# Patient Record
Sex: Female | Born: 1950
Health system: Southern US, Community
[De-identification: ages and names within clinical notes are randomized; demographics above are authoritative.]

## PROBLEM LIST (undated history)

## (undated) DIAGNOSIS — E348 Other specified endocrine disorders: Secondary | ICD-10-CM

## (undated) DIAGNOSIS — K900 Celiac disease: Principal | ICD-10-CM

## (undated) HISTORY — DX: Other specified endocrine disorders: E34.8

## (undated) HISTORY — DX: Celiac disease: K90.0

## (undated) HISTORY — PX: COLONOSCOPY: SHX174

## (undated) HISTORY — PX: TONSILLECTOMY: SUR1361

---

## 2006-07-04 DIAGNOSIS — K900 Celiac disease: Secondary | ICD-10-CM

## 2006-07-04 HISTORY — DX: Celiac disease: K90.0

## 2017-04-11 NOTE — Progress Notes (Signed)
Subjective: QB:[id number] care HPI: Joanna Perkins. Joanna Perkins is a 66 y.o. female presenting to clinic today for:  Celiac disease./ Lyme Patient with a past medical history significant for celiac disease which was diagnosed in 2008. She reports a strong family history of celiac disease, which affects both of her sisters. She does note a history of vitamin D deficiency several years ago. She has been on vitamin D replacement daily since then. She notes a holistic approach to her healthcare and is on no prescribed medications but on many supplements, including what is prescribed to her by a homeopathic provider in [place] for her Lyme disease, which was diagnosed in 2010. She reports complete avoidance of gluten. Her last colonoscopy was at 66 years old, she notes that this was not a clean colonoscopy but that no polyps were seen. She has never had a DEXA scan. Family history significant for osteoporosis in her grandmother.  Hx SCC Patient reports a history of squamous cell carcinoma that was excised in 2017 by Mohs surgery. This was located on the left lower jaw. She has not seen a dermatologist since then but is willing to reestablish. No fevers, chills, unplanned weight loss. No new lesions.  Postmenopausal Postmenopausal since her 86s. She's never had biologic children of her own. Last mammogram greater than 2 years ago. She notes that she has fibrous breasts and was getting them every year. She wishes to have 3-D mammography. No history of DEXA in the past. No history of osteoporosis/fracture.  She has not had a Pap in a couple of years. No abnormal vaginal discharge or bleeding.  Past Medical History:  Diagnosis Date  . Anxiety   . Celiac disease 2008   History reviewed. No pertinent surgical history. Social History   Social History  . Marital status: Married    Spouse name: N/A  . Number of children: N/A  . Years of education: N/A   Occupational History  . Not on file.   Social History  Main Topics  . Smoking status: Former Smoker    Packs/day: 1.00    Years: 15.00    Types: Cigarettes    Quit date: 54  . Smokeless tobacco: Never Used  . Alcohol use 6.6 oz/week    7 Glasses of wine, 4 Cans of beer per week     Comment: per week  . Drug use: No  . Sexual activity: Not on file   Other Topics Concern  . 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 She recently relocated resides with her husband Joanna Perkins.  No biologic children but she does have one stepchild and 2 grandchildren. She prefers holistic approach to her healthcare.   No outpatient prescriptions have been marked as taking for the 04/18/17 encounter (Office Visit) with [name], DO.   Family History  Problem Relation Age of Onset  . Asthma Mother   . Hepatitis C Father   . Breast cancer Sister   . Celiac disease Sister   . Celiac disease Brother   . Heart attack Maternal Grandfather   . Heart attack Paternal Grandfather   . Celiac disease Sister    Not on File   Health Maintenance: will need to obtain records re: vaccines.  Mammo, colonoscopy and DXA due. ROS: Per HPI  Objective: Office vital signs reviewed. BP 121/82   Pulse 69   Temp 97.8 F (36.6  C)   Wt 140 lb (63.5 kg)   Physical Examination:  General: Awake, alert, well nourished, well appearing female, No acute distress HEENT: Normal; small well healed scar along the left lower jaw.    Neck: No masses palpated. No lymphadenopathy    Ears: Tympanic membranes intact, normal light reflex, no erythema, no bulging    Eyes: PERRLA, extraocular movement in tact, sclera [place]inger  Cardio: regular rate and rhythm, S1S2 heard, no murmurs appreciated Pulm: clear to auscultation bilaterally, no wheezes, rhonchi or rales; normal work of breathing on room air Ext: WWP, no edema, +2 pulses MSK: normal gait and station Neuro: no focal deficits.  Follows  commands. Skin: no lesions, scar as above. Psych: Mood stable, speech normal, affect appropriate, thought process normal, pleasant.  Assessment/ Plan: 65 y.o. female   Celiac disease Has been well controlled with avoidance of gluten. She has not had a colonoscopy in almost 15 years. Referral to GI has been placed so that she may establish and have a screening colonoscopy performed. No red flag symptoms. We'll also obtain vitamin B12 and vitamin D labs. CBC and CMP also ordered.  History of squamous cell carcinoma I recommended that she establish with a dermatologist. Burnis Medin also plan to survey her skin on an annual basis during her physical exams. No new concerning lesions.  Lyme disease Per patient diagnosed on labs in 2010. She sees a homeopathic in [place] once a year and uses homeopathic remedies to control symptoms. She is completely asymptomatic.  Post-menopausal She has never had a DEXA scan. This is been ordered and patient will schedule this at her convenience.  Encounter to establish care with new doctor Release of information form completed by patient. We'll obtain previous records and immunizations.  Screening for hyperlipidemia Normal BMI. Per patient has not had labs done in over 2 years. - Lipid Panel  Screening for osteoporosis - [facility] DEXA  Screening for colon cancer - Ambulatory referral to Gastroenterology  Patient follow-up as needed. I'll contact her with the results of her labs once available. She will schedule her mammogram, colonoscopy, and DEXA scan. Once he studies have resulted, I will contact her.  [name], DO [place] [phone]

## 2017-04-18 ENCOUNTER — Ambulatory Visit (INDEPENDENT_AMBULATORY_CARE_PROVIDER_SITE_OTHER): Payer: Medicare Other | Admitting: Family Medicine

## 2017-04-18 ENCOUNTER — Encounter: Payer: Self-pay | Admitting: Family Medicine

## 2017-04-18 VITALS — BP 121/82 | HR 69 | Temp 97.8°F | Wt 140.0 lb

## 2017-04-18 DIAGNOSIS — Z13820 Encounter for screening for osteoporosis: Secondary | ICD-10-CM

## 2017-04-18 DIAGNOSIS — Z1211 Encounter for screening for malignant neoplasm of colon: Secondary | ICD-10-CM | POA: Diagnosis not present

## 2017-04-18 DIAGNOSIS — K900 Celiac disease: Secondary | ICD-10-CM | POA: Diagnosis not present

## 2017-04-18 DIAGNOSIS — Z8589 Personal history of malignant neoplasm of other organs and systems: Secondary | ICD-10-CM

## 2017-04-18 DIAGNOSIS — Z780 Asymptomatic menopausal state: Secondary | ICD-10-CM | POA: Insufficient documentation

## 2017-04-18 DIAGNOSIS — Z7689 Persons encountering health services in other specified circumstances: Secondary | ICD-10-CM | POA: Diagnosis not present

## 2017-04-18 DIAGNOSIS — A6920 Lyme disease, unspecified: Secondary | ICD-10-CM | POA: Diagnosis not present

## 2017-04-18 DIAGNOSIS — Z13220 Encounter for screening for lipoid disorders: Secondary | ICD-10-CM | POA: Diagnosis not present

## 2017-04-18 NOTE — Patient Instructions (Signed)
I will contact you will the results of your labs.  If anything is abnormal, I will call you.    Schedule your mammogram, DEXA, colonoscopy.  Health Maintenance, Female Adopting a healthy lifestyle and getting preventive care can go a long way to promote health and wellness. Talk with your health care provider about what schedule of regular examinations is right for you. This is a good chance for you to check in with your provider about disease prevention and staying healthy. In between checkups, there are plenty of things you can do on your own. Experts have done a lot of research about which lifestyle changes and preventive measures are most likely to keep you healthy. Ask your health care provider for more information. Weight and diet Eat a healthy diet  Be sure to include plenty of vegetables, fruits, low-fat dairy products, and lean protein.  Do not eat a lot of foods high in solid fats, added sugars, or salt.  Get regular exercise. This is one of the most important things you can do for your health. ? Most adults should exercise for at least 150 minutes each week. The exercise should increase your heart rate and make you sweat (moderate-intensity exercise). ? Most adults should also do strengthening exercises at least twice a week. This is in addition to the moderate-intensity exercise.  Maintain a healthy weight  Body mass index (BMI) is a measurement that can be used to identify possible weight problems. It estimates body fat based on height and weight. Your health care provider can help determine your BMI and help you achieve or maintain a healthy weight.  For females 5 years of age and older: ? A BMI below 18.5 is considered underweight. ? A BMI of 18.5 to 24.9 is normal. ? A BMI of 25 to 29.9 is considered overweight. ? A BMI of 30 and above is considered obese.  Watch levels of cholesterol and blood lipids  You should start having your blood tested for lipids and cholesterol  at 65 years of age, then have this test every 5 years.  You may need to have your cholesterol levels checked more often if: ? Your lipid or cholesterol levels are high. ? You are older than 65 years of age. ? You are at high risk for heart disease.  Cancer screening Lung Cancer  Lung cancer screening is recommended for adults 14-5 years old who are at high risk for lung cancer because of a history of smoking.  A yearly low-dose CT scan of the lungs is recommended for people who: ? Currently smoke. ? Have quit within the past 15 years. ? Have at least a 30-pack-year history of smoking. A pack year is smoking an average of one pack of cigarettes a day for 1 year.  Yearly screening should continue until it has been 15 years since you quit.  Yearly screening should stop if you develop a health problem that would prevent you from having lung cancer treatment.  Breast Cancer  Practice breast self-awareness. This means understanding how your breasts normally appear and feel.  It also means doing regular breast self-exams. Let your health care provider know about any changes, no matter how small.  If you are in your 20s or 30s, you should have a clinical breast exam (CBE) by a health care provider every 1-3 years as part of a regular health exam.  If you are 2 or older, have a CBE every year. Also consider having a breast X-ray (mammogram)  every year.  If you have a family history of breast cancer, talk to your health care provider about genetic screening.  If you are at high risk for breast cancer, talk to your health care provider about having an MRI and a mammogram every year.  Breast cancer gene (BRCA) assessment is recommended for women who have family members with BRCA-related cancers. BRCA-related cancers include: ? Breast. ? Ovarian. ? Tubal. ? Peritoneal cancers.  Results of the assessment will determine the need for genetic counseling and BRCA1 and BRCA2  testing.  Cervical Cancer Your health care provider may recommend that you be screened regularly for cancer of the pelvic organs (ovaries, uterus, and vagina). This screening involves a pelvic examination, including checking for microscopic changes to the surface of your cervix (Pap test). You may be encouraged to have this screening done every 3 years, beginning at age 57.  For women ages 18-65, health care providers may recommend pelvic exams and Pap testing every 3 years, or they may recommend the Pap and pelvic exam, combined with testing for human papilloma virus (HPV), every 5 years. Some types of HPV increase your risk of cervical cancer. Testing for HPV may also be done on women of any age with unclear Pap test results.  Other health care providers may not recommend any screening for nonpregnant women who are considered low risk for pelvic cancer and who do not have symptoms. Ask your health care provider if a screening pelvic exam is right for you.  If you have had past treatment for cervical cancer or a condition that could lead to cancer, you need Pap tests and screening for cancer for at least 20 years after your treatment. If Pap tests have been discontinued, your risk factors (such as having a new sexual partner) need to be reassessed to determine if screening should resume. Some women have medical problems that increase the chance of getting cervical cancer. In these cases, your health care provider may recommend more frequent screening and Pap tests.  Colorectal Cancer  This type of cancer can be detected and often prevented.  Routine colorectal cancer screening usually begins at 65 years of age and continues through 65 years of age.  Your health care provider may recommend screening at an earlier age if you have risk factors for colon cancer.  Your health care provider may also recommend using home test kits to check for hidden blood in the stool.  A small camera at the end of a  tube can be used to examine your colon directly (sigmoidoscopy or colonoscopy). This is done to check for the earliest forms of colorectal cancer.  Routine screening usually begins at age 67.  Direct examination of the colon should be repeated every 5-10 years through 65 years of age. However, you may need to be screened more often if early forms of precancerous polyps or small growths are found.  Skin Cancer  Check your skin from head to toe regularly.  Tell your health care provider about any new moles or changes in moles, especially if there is a change in a mole's shape or color.  Also tell your health care provider if you have a mole that is larger than the size of a pencil eraser.  Always use sunscreen. Apply sunscreen liberally and repeatedly throughout the day.  Protect yourself by wearing long sleeves, pants, a wide-brimmed hat, and sunglasses whenever you are outside.  Heart disease, diabetes, and high blood pressure  High blood pressure causes  heart disease and increases the risk of stroke. High blood pressure is more likely to develop in: ? People who have blood pressure in the high end of the normal range (130-139/85-89 mm Hg). ? People who are overweight or obese. ? People who are African American.  If you are 78-68 years of age, have your blood pressure checked every 3-5 years. If you are 70 years of age or older, have your blood pressure checked every year. You should have your blood pressure measured twice-once when you are at a hospital or clinic, and once when you are not at a hospital or clinic. Record the average of the two measurements. To check your blood pressure when you are not at a hospital or clinic, you can use: ? An automated blood pressure machine at a pharmacy. ? A home blood pressure monitor.  If you are between 22 years and 73 years old, ask your health care provider if you should take aspirin to prevent strokes.  Have regular diabetes screenings. This  involves taking a blood sample to check your fasting blood sugar level. ? If you are at a normal weight and have a low risk for diabetes, have this test once every three years after 65 years of age. ? If you are overweight and have a high risk for diabetes, consider being tested at a younger age or more often. Preventing infection Hepatitis B  If you have a higher risk for hepatitis B, you should be screened for this virus. You are considered at high risk for hepatitis B if: ? You were born in a country where hepatitis B is common. Ask your health care provider which countries are considered high risk. ? Your parents were born in a high-risk country, and you have not been immunized against hepatitis B (hepatitis B vaccine). ? You have HIV or AIDS. ? You use needles to inject street drugs. ? You live with someone who has hepatitis B. ? You have had sex with someone who has hepatitis B. ? You get hemodialysis treatment. ? You take certain medicines for conditions, including cancer, organ transplantation, and autoimmune conditions.  Hepatitis C  Blood testing is recommended for: ? Everyone born from [birth year] through 1964. ? Anyone with known risk factors for hepatitis C.  Sexually transmitted infections (STIs)  You should be screened for sexually transmitted infections (STIs) including gonorrhea and chlamydia if: ? You are sexually active and are younger than 65 years of age. ? You are older than 65 years of age and your health care provider tells you that you are at risk for this type of infection. ? Your sexual activity has changed since you were last screened and you are at an increased risk for chlamydia or gonorrhea. Ask your health care provider if you are at risk.  If you do not have HIV, but are at risk, it may be recommended that you take a prescription medicine daily to prevent HIV infection. This is called pre-exposure prophylaxis (PrEP). You are considered at risk if: ? You are  sexually active and do not regularly use condoms or know the HIV status of your partner(s). ? You take drugs by injection. ? You are sexually active with a partner who has HIV.  Talk with your health care provider about whether you are at high risk of being infected with HIV. If you choose to begin PrEP, you should first be tested for HIV. You should then be tested every 3 months for as long as  you are taking PrEP. Pregnancy  If you are premenopausal and you may become pregnant, ask your health care provider about preconception counseling.  If you may become pregnant, take 400 to 800 micrograms (mcg) of folic acid every day.  If you want to prevent pregnancy, talk to your health care provider about birth control (contraception). Osteoporosis and menopause  Osteoporosis is a disease in which the bones lose minerals and strength with aging. This can result in serious bone fractures. Your risk for osteoporosis can be identified using a bone density scan.  If you are 53 years of age or older, or if you are at risk for osteoporosis and fractures, ask your health care provider if you should be screened.  Ask your health care provider whether you should take a calcium or vitamin D supplement to lower your risk for osteoporosis.  Menopause may have certain physical symptoms and risks.  Hormone replacement therapy may reduce some of these symptoms and risks. Talk to your health care provider about whether hormone replacement therapy is right for you. Follow these instructions at home:  Schedule regular health, dental, and eye exams.  Stay current with your immunizations.  Do not use any tobacco products including cigarettes, chewing tobacco, or electronic cigarettes.  If you are pregnant, do not drink alcohol.  If you are breastfeeding, limit how much and how often you drink alcohol.  Limit alcohol intake to no more than 1 drink per day for nonpregnant women. One drink equals 12 ounces of  beer, 5 ounces of wine, or 1 ounces of hard liquor.  Do not use street drugs.  Do not share needles.  Ask your health care provider for help if you need support or information about quitting drugs.  Tell your health care provider if you often feel depressed.  Tell your health care provider if you have ever been abused or do not feel safe at home. This information is not intended to replace advice given to you by your health care provider. Make sure you discuss any questions you have with your health care provider. Document Released: 01/03/2011 Document Revised: 11/26/2015 Document Reviewed: 03/24/2015 Elsevier Interactive Patient Education  [employer].

## 2017-04-18 NOTE — Assessment & Plan Note (Signed)
She has never had a DEXA scan. This is been ordered and patient will schedule this at her convenience.

## 2017-04-18 NOTE — Assessment & Plan Note (Signed)
Per patient diagnosed on labs in 2010. She sees a homeopathic in [place] once a year and uses homeopathic remedies to control symptoms. She is completely asymptomatic.

## 2017-04-18 NOTE — Assessment & Plan Note (Signed)
I recommended that she establish with a dermatologist. Joanna Perkins also plan to survey her skin on an annual basis during her physical exams. No new concerning lesions.

## 2017-04-18 NOTE — Assessment & Plan Note (Signed)
Has been well controlled with avoidance of gluten. She has not had a colonoscopy in almost 15 years. Referral to GI has been placed so that she may establish and have a screening colonoscopy performed. No red flag symptoms. We'll also obtain vitamin B12 and vitamin D labs. CBC and CMP also ordered.

## 2017-04-19 LAB — CMP14+EGFR
ALBUMIN: 4.2 g/dL (ref 3.6–4.8)
ALK PHOS: 48 IU/L (ref 39–117)
ALT: 16 IU/L (ref 0–32)
AST: 23 IU/L (ref 0–40)
Albumin/Globulin Ratio: 1.7 (ref 1.2–2.2)
BUN/Creatinine Ratio: 16 (ref 12–28)
BUN: 14 mg/dL (ref 8–27)
Bilirubin Total: 0.5 mg/dL (ref 0.0–1.2)
CHLORIDE: 102 mmol/L (ref 96–106)
CO2: 23 mmol/L (ref 20–29)
CREATININE: 0.88 mg/dL (ref 0.57–1.00)
Calcium: 9.2 mg/dL (ref 8.7–10.3)
GFR calc Af Amer: 80 mL/min/{1.73_m2} (ref >59)
GFR calc non Af Amer: 69 mL/min/{1.73_m2} (ref >59)
GLUCOSE: 91 mg/dL (ref 65–99)
Globulin, Total: 2.5 g/dL (ref 1.5–4.5)
Potassium: 4.3 mmol/L (ref 3.5–5.2)
Sodium: 141 mmol/L (ref 134–144)
Total Protein: 6.7 g/dL (ref 6.0–8.5)

## 2017-04-19 LAB — CBC
Hematocrit: 40.4 % (ref 34.0–46.6)
Hemoglobin: 13 g/dL (ref 11.1–15.9)
MCH: 30.2 pg (ref 26.6–33.0)
MCHC: 32.2 g/dL (ref 31.5–35.7)
MCV: 94 fL (ref 79–97)
PLATELETS: 254 10*3/uL (ref 150–379)
RBC: 4.31 x10E6/uL (ref 3.77–5.28)
RDW: 13.9 % (ref 12.3–15.4)
WBC: 4.7 10*3/uL (ref 3.4–10.8)

## 2017-04-19 LAB — LIPID PANEL
CHOLESTEROL TOTAL: 197 mg/dL (ref 100–199)
Chol/HDL Ratio: 2.1 ratio (ref 0.0–4.4)
HDL: 93 mg/dL (ref >39)
LDL CALC: 95 mg/dL (ref 0–99)
TRIGLYCERIDES: 43 mg/dL (ref 0–149)
VLDL CHOLESTEROL CAL: 9 mg/dL (ref 5–40)

## 2017-04-19 LAB — VITAMIN B12: Vitamin B-12: 827 pg/mL (ref 232–1245)

## 2017-04-19 LAB — VITAMIN D 25 HYDROXY (VIT D DEFICIENCY, FRACTURES): VIT D 25 HYDROXY: 41.8 ng/mL (ref 30.0–100.0)

## 2017-04-22 ENCOUNTER — Encounter: Payer: Self-pay | Admitting: Family Medicine

## 2017-04-24 ENCOUNTER — Other Ambulatory Visit: Payer: Self-pay | Admitting: Family Medicine

## 2017-04-24 DIAGNOSIS — Z8589 Personal history of malignant neoplasm of other organs and systems: Secondary | ICD-10-CM

## 2017-04-27 ENCOUNTER — Other Ambulatory Visit: Payer: Medicare Other

## 2017-05-01 ENCOUNTER — Encounter: Payer: Self-pay | Admitting: Family Medicine

## 2017-05-01 ENCOUNTER — Other Ambulatory Visit (INDEPENDENT_AMBULATORY_CARE_PROVIDER_SITE_OTHER): Payer: Medicare Other

## 2017-05-01 DIAGNOSIS — M8580 Other specified disorders of bone density and structure, unspecified site: Secondary | ICD-10-CM | POA: Insufficient documentation

## 2017-05-01 DIAGNOSIS — Z23 Encounter for immunization: Secondary | ICD-10-CM

## 2017-05-01 DIAGNOSIS — Z780 Asymptomatic menopausal state: Secondary | ICD-10-CM | POA: Diagnosis not present

## 2017-05-01 NOTE — Progress Notes (Unsigned)
HD fluzone given. Pt tolerated well

## 2017-05-22 ENCOUNTER — Encounter: Payer: Self-pay | Admitting: Gastroenterology

## 2017-07-07 ENCOUNTER — Ambulatory Visit (AMBULATORY_SURGERY_CENTER): Payer: Self-pay | Admitting: *Deleted

## 2017-07-07 ENCOUNTER — Other Ambulatory Visit: Payer: Self-pay

## 2017-07-07 VITALS — Ht 67.0 in | Wt 139.2 lb

## 2017-07-07 DIAGNOSIS — Z1211 Encounter for screening for malignant neoplasm of colon: Secondary | ICD-10-CM

## 2017-07-07 MED ORDER — PEG-KCL-NACL-NASULF-NA ASC-C 140 G PO SOLR: 1.0000 | ORAL | 0 refills | Status: DC

## 2017-07-07 NOTE — Progress Notes (Signed)
No egg or soy allergy known to patient  No issues with past sedation with any surgeries  or procedures, no intubation problems  No diet pills per patient No home 02 use per patient  No blood thinners per patient  Pt denies issues with constipation  No A fib or A flutter  EMMI video sent to pt's e mail pt. declined

## 2017-07-14 ENCOUNTER — Encounter: Payer: Self-pay | Admitting: Gastroenterology

## 2017-07-19 ENCOUNTER — Other Ambulatory Visit: Payer: Self-pay | Admitting: Family Medicine

## 2017-07-19 ENCOUNTER — Encounter: Payer: Self-pay | Admitting: Family Medicine

## 2017-07-19 MED ORDER — DOXYCYCLINE HYCLATE 100 MG PO TABS: 100.0000 mg | ORAL_TABLET | Freq: Two times a day (BID) | ORAL | 0 refills | Status: AC

## 2017-07-20 ENCOUNTER — Encounter: Payer: Self-pay | Admitting: Gastroenterology

## 2017-07-20 ENCOUNTER — Other Ambulatory Visit: Payer: Self-pay

## 2017-07-20 ENCOUNTER — Ambulatory Visit (AMBULATORY_SURGERY_CENTER): Payer: Medicare Other | Admitting: Gastroenterology

## 2017-07-20 VITALS — BP 116/65 | HR 52 | Temp 98.4°F | Resp 11 | Ht 67.0 in | Wt 139.0 lb

## 2017-07-20 DIAGNOSIS — D122 Benign neoplasm of ascending colon: Secondary | ICD-10-CM

## 2017-07-20 DIAGNOSIS — Z1212 Encounter for screening for malignant neoplasm of rectum: Secondary | ICD-10-CM | POA: Diagnosis not present

## 2017-07-20 DIAGNOSIS — D126 Benign neoplasm of colon, unspecified: Secondary | ICD-10-CM

## 2017-07-20 DIAGNOSIS — D120 Benign neoplasm of cecum: Secondary | ICD-10-CM

## 2017-07-20 DIAGNOSIS — D125 Benign neoplasm of sigmoid colon: Secondary | ICD-10-CM | POA: Diagnosis not present

## 2017-07-20 DIAGNOSIS — K635 Polyp of colon: Secondary | ICD-10-CM

## 2017-07-20 DIAGNOSIS — Z1211 Encounter for screening for malignant neoplasm of colon: Secondary | ICD-10-CM | POA: Diagnosis present

## 2017-07-20 MED ORDER — SODIUM CHLORIDE 0.9 % IV SOLN: 500.0000 mL | Freq: Once | INTRAVENOUS | Status: DC

## 2017-07-20 NOTE — Patient Instructions (Signed)
YOU HAD AN ENDOSCOPIC PROCEDURE TODAY AT [facility]:   Refer to the procedure report that was given to you for any specific questions about what was found during the examination.  If the procedure report does not answer your questions, please call your gastroenterologist to clarify.  If you requested that your care partner not be given the details of your procedure findings, then the procedure report has been included in a sealed envelope for you to review at your convenience later.  YOU SHOULD EXPECT: Some feelings of bloating in the abdomen. Passage of more gas than usual.  Walking can help get rid of the air that was put into your GI tract during the procedure and reduce the bloating. If you had a lower endoscopy (such as a colonoscopy or flexible sigmoidoscopy) you may notice spotting of blood in your stool or on the toilet paper. If you underwent a bowel prep for your procedure, you may not have a normal bowel movement for a few days.  Please Note:  You might notice some irritation and congestion in your nose or some drainage.  This is from the oxygen used during your procedure.  There is no need for concern and it should clear up in a day or so.  SYMPTOMS TO REPORT IMMEDIATELY:   Following lower endoscopy (colonoscopy or flexible sigmoidoscopy):  Excessive amounts of blood in the stool  Significant tenderness or worsening of abdominal pains  Swelling of the abdomen that is new, acute  Fever of 100F or higher   For urgent or emergent issues, a gastroenterologist can be reached at any hour by calling [phone].   DIET:  We do recommend a small meal at first, but then you may proceed to your regular diet.  Drink plenty of fluids but you should avoid alcoholic beverages for 24 hours. Try to increase the fiber in your diet, and drink plenty of water.  ACTIVITY:  You should plan to take it easy for the rest of today and you should NOT DRIVE or use heavy machinery until  tomorrow (because of the sedation medicines used during the test).    FOLLOW UP: Our staff will call the number listed on your records the next business day following your procedure to check on you and address any questions or concerns that you may have regarding the information given to you following your procedure. If we do not reach you, we will leave a message.  However, if you are feeling well and you are not experiencing any problems, there is no need to return our call.  We will assume that you have returned to your regular daily activities without incident.  If any biopsies were taken you will be contacted by phone or by letter within the next 1-3 weeks.  Please call us at [phone] if you have not heard about the biopsies in 3 weeks.    SIGNATURES/CONFIDENTIALITY: You and/or your care partner have signed paperwork which will be entered into your electronic medical record.  These signatures attest to the fact that that the information above on your After Visit Summary has been reviewed and is understood.  Full responsibility of the confidentiality of this discharge information lies with you and/or your care-partner.  Read all of the handouts given to  You by your recovery room nurse.

## 2017-07-20 NOTE — Progress Notes (Signed)
Called to room to assist during endoscopic procedure.  Patient ID and intended procedure confirmed with present staff. Received instructions for my participation in the procedure from the performing physician.  

## 2017-07-20 NOTE — Op Note (Signed)
[facility] Patient Name: Joanna Perkins Procedure Date: 07/20/2017 11:00 AM MRN: [id number] Endoscopist: Remo Lipps P. Arron Tetrault MD, MD Age: 67 Referring MD:  Date of Birth: 10-15-1950 Gender: Female Account #: [id number] Procedure:                Colonoscopy Indications:              Screening for colorectal malignant neoplasm Medicines:                Monitored Anesthesia Care Procedure:                Pre-Anesthesia Assessment:                           - Prior to the procedure, a History and Physical                            was performed, and patient medications and                            allergies were reviewed. The patient's tolerance of                            previous anesthesia was also reviewed. The risks                            and benefits of the procedure and the sedation                            options and risks were discussed with the patient.                            All questions were answered, and informed consent                            was obtained. Prior Anticoagulants: The patient has                            taken no previous anticoagulant or antiplatelet                            agents. ASA Grade Assessment: II - A patient with                            mild systemic disease. After reviewing the risks                            and benefits, the patient was deemed in                            satisfactory condition to undergo the procedure.                           After obtaining informed consent, the colonoscope  was passed under direct vision. Throughout the                            procedure, the patient's blood pressure, pulse, and                            oxygen saturations were monitored continuously. The                            Model PCF-H190DL [id number]) scope was introduced                            through the anus and advanced to the the cecum,                            identified  by appendiceal orifice and ileocecal                            valve. The colonoscopy was technically difficult                            and complex due to a tortuous colon. The patient                            tolerated the procedure well. The quality of the                            bowel preparation was good. The ileocecal valve,                            appendiceal orifice, and rectum were photographed. Scope In: 11:05:19 AM Scope Out: 11:35:29 AM Scope Withdrawal Time: 0 hours 23 minutes 17 seconds  Total Procedure Duration: 0 hours 30 minutes 10 seconds  Findings:                 The perianal and digital rectal examinations were                            normal.                           A 4 mm polyp was found in the ileocecal valve. The                            polyp was sessile. The polyp was removed with a                            cold snare. Resection and retrieval were complete.                           A diminutive polyp was found in the ascending                            colon. The polyp was  sessile. The polyp was removed                            with a cold snare. Resection and retrieval were                            complete.                           A 4 mm polyp was found in the sigmoid colon. The                            polyp was sessile. The polyp was removed with a                            cold snare. Resection and retrieval were complete.                           A few small-mouthed diverticula were found in the                            ascending colon.                           Anal papilla(e) were hypertrophied.                           The colon was severely tortuous which prolonged the                            procedure.                           The exam was otherwise without abnormality. Complications:            No immediate complications. Estimated blood loss:                            Minimal. Estimated Blood Loss:      Estimated blood loss was minimal. Impression:               - One 4 mm polyp at the ileocecal valve, removed                            with a cold snare. Resected and retrieved.                           - One diminutive polyp in the ascending colon,                            removed with a cold snare. Resected and retrieved.                           - One 4 mm polyp in the sigmoid colon, removed with  a cold snare. Resected and retrieved.                           - Diverticulosis in the ascending colon.                           - Anal papilla(e) were hypertrophied.                           - Tortuous colon.                           - The examination was otherwise normal. Recommendation:           - Patient has a contact number available for                            emergencies. The signs and symptoms of potential                            delayed complications were discussed with the                            patient. Return to normal activities tomorrow.                            Written discharge instructions were provided to the                            patient.                           - Resume previous diet.                           - Continue present medications.                           - Await pathology results.                           - Repeat colonoscopy is recommended for                            surveillance. The colonoscopy date will be                            determined after pathology results from today's                            exam become available for review. Remo Lipps P. Kenton Fortin MD, MD 07/20/2017 11:40:06 AM This report has been signed electronically.

## 2017-07-20 NOTE — Progress Notes (Signed)
Pt's states no medical or surgical changes since previsit or office visit. 

## 2017-07-21 ENCOUNTER — Telehealth: Payer: Self-pay | Admitting: *Deleted

## 2017-07-21 NOTE — Telephone Encounter (Signed)
Attempted second post procedure follow up call.  No answer.  Left message.

## 2017-07-21 NOTE — Telephone Encounter (Signed)
Attempted post procedure follow up call.  No answer.  Will attempt again this afternoon.

## 2017-07-26 ENCOUNTER — Encounter: Payer: Self-pay | Admitting: Gastroenterology

## 2017-07-26 ENCOUNTER — Encounter: Payer: Self-pay | Admitting: Family Medicine

## 2017-08-28 ENCOUNTER — Encounter: Payer: Self-pay | Admitting: Family Medicine

## 2018-04-23 ENCOUNTER — Ambulatory Visit: Payer: Medicare Other

## 2018-07-18 ENCOUNTER — Encounter: Payer: Self-pay | Admitting: Family Medicine

## 2018-08-21 ENCOUNTER — Other Ambulatory Visit: Payer: Self-pay | Admitting: Sports Medicine

## 2018-08-21 ENCOUNTER — Encounter: Payer: Self-pay | Admitting: Sports Medicine

## 2018-08-21 ENCOUNTER — Ambulatory Visit (INDEPENDENT_AMBULATORY_CARE_PROVIDER_SITE_OTHER): Payer: Medicare Other

## 2018-08-21 ENCOUNTER — Ambulatory Visit: Payer: Medicare Other | Admitting: Sports Medicine

## 2018-08-21 VITALS — BP 128/68

## 2018-08-21 DIAGNOSIS — M205X1 Other deformities of toe(s) (acquired), right foot: Secondary | ICD-10-CM | POA: Diagnosis not present

## 2018-08-21 DIAGNOSIS — M79674 Pain in right toe(s): Secondary | ICD-10-CM

## 2018-08-21 NOTE — Progress Notes (Signed)
Subjective: Joanna Perkins. Neider is a 68 y.o. female patient who presents to office for evaluation of right foot pain. Patient complains of progressive pain especially over the last month patient ranks pain now 5 out of 10 states that since she cut back on walking the dog and started to rest the pain has gotten a lot better; use to be 8 out of 10.  Patient reports no known injury besides increasing activity of walking and [place].  Patient reports that she tried rest ice elevation and Epson salt soaks; patient denies any recent trauma or injury.  Review of Systems  Musculoskeletal: Positive for joint pain.     Patient Active Problem List   Diagnosis Date Noted  . Osteopenia 05/01/2017  . Celiac disease 04/18/2017  . Lyme disease 04/18/2017  . History of squamous cell carcinoma 04/18/2017  . Post-menopausal 04/18/2017    Current Outpatient Medications on File Prior to Visit  Medication Sig Dispense Refill  . b complex vitamins capsule Take 1 capsule by mouth daily.    . Calcium-Magnesium-Vitamin D (CALCIUM 500 PO) Take 1 capsule by mouth daily.    . Iodine, Kelp, (KELP PO) Take 1 capsule by mouth daily.    Marland Kitchen OVER THE COUNTER MEDICATION 1 capsule daily. Bladderwreck    . TURMERIC PO Take 1 capsule by mouth daily.     Current Facility-Administered Medications on File Prior to Visit  Medication Dose Route Frequency Provider Last Rate Last Dose  . 0.9 %  sodium chloride infusion  500 mL Intravenous Once Armbruster, Carlota Raspberry, MD        Allergies  Allergen Reactions  . Gluten Meal     Objective:  General: Alert and oriented x3 in no acute distress  Dermatology: No open lesions bilateral lower extremities, no webspace macerations, no ecchymosis bilateral, all nails x 10 are well manicured.  Vascular: Dorsalis Pedis and Posterior Tibial pedal pulses palpable, Capillary Fill Time 3 seconds,(+) pedal hair growth bilateral, no edema bilateral lower extremities,  Temperature gradient within normal limits.  Neurology: Johney Maine sensation intact via light touch bilateral.  Musculoskeletal: Mild tenderness with palpation at end range of motion at right first metatarsophalangeal joint,No pain with calf compression bilateral. There is decreased ankle rom with knee extending  vs flexed resembling gastroc equnius bilateral, Subtalar joint range of motion is within normal limits, there is no 1st ray hypermobility noted bilateral, decreased 1st MPJ rom Right>Left with functional limitus noted on weightbearing exam. Strength within normal limits in all groups bilateral.   Gait: Antalgic gait  Xrays  Right foot   Impression: There is first metatarsophalangeal joint space narrowing with dorsal spur, no fracture or dislocation, no other acute findings.  Assessment and Plan: Problem List Items Addressed This Visit    None    Visit Diagnoses    Hallux limitus of right foot    -  Primary   Pain of toe of right foot           -Complete examination performed -Xrays reviewed -Discussed treatement options for hallux limitus: Right -Patient declines steroid injection at this time or any oral medications by mouth -Rx offloading metatarsal sleeve padding -Recommend holistic medications for joint pain and arthritis -Patient to return to office as needed or sooner if condition worsens.  Advised patient that she may benefit in the future from custom orthotics if fails to get relief with offloading padding in the future.  Landis Martins, DPM

## 2018-08-21 NOTE — Patient Instructions (Signed)
Hallux Rigidus  Hallux rigidus is a type of joint pain or joint disease (arthritis) that affects your big toe (hallux). This condition involves the joint that connects the base of your big toe to the main part of your foot (metatarsophalangeal joint). This condition can cause your big toe to become stiff, painful, and difficult to move. Symptoms may get worse with movement or in cold or damp weather. The condition also gets worse over time. What are the causes? This condition may be caused by having a foot that does not function the way that it should or has an abnormal shape (structural deformity). These foot problems can run in families (be hereditary). This condition can also be caused by:  Injury.  Overuse.  Certain inflammatory diseases, including gout and rheumatoid arthritis. What increases the risk? This condition is more likely to develop in people who:  Have a foot bone (metatarsal) that is longer or higher than normal.  Have a family history of hallux rigidus.  Have previously injured their big toe.  Have feet that do not have a curve (arch) on the inner side of the foot. This may be called flat feet or fallen arches.  Turn their ankles in when they walk (pronation).  Have rheumatoid arthritis or gout.  Have to stoop down often at work. What are the signs or symptoms? Symptoms of this condition include:  Big toe pain.  Stiffness and difficulty moving the big toe.  Swelling of the toe and surrounding area.  Bone spurs. These are bony growths that can form on the joint of the big toe.  A limp. How is this diagnosed? This condition is diagnosed based on a medical history and physical exam. This may include X-rays. How is this treated? Treatment for this condition includes:  Wearing roomy, comfortable shoes that have a large toe box.  Putting orthotic devices in your shoes.  Pain medicines.  Physical therapy.  Icing the injured area.  Alternate between  putting your foot in cold water then warm water. If your condition is severe, treatment may include:  Corticosteroid injections to relieve pain.  Surgery to remove bone spurs, fuse damaged bones together, or replace the entire joint. Follow these instructions at home:  Take over-the-counter and prescription medicines only as told by your health care provider.  Do not wear high heels or other restrictive footwear. Wear comfortable, supportive shoes that have a large toe box.  Wear orthotics as told by your health care provider, if this applies.  Put your feet in cold water for 30 seconds, then in warm water for 30 seconds. Alternate between the cold and warm water for 5 minutes. Do this several times a day or as told by your health care provider.  If directed, apply ice to the injured area. ? Put ice in a plastic bag. ? Place a towel between your skin and the bag. ? Leave the ice on for 20 minutes, 2-3 times per day.  Do foot exercises as instructed by your health care provider or a physical therapist.  Keep all follow-up visits as told by your health care provider. This is important. Contact a health care provider if:  You notice bone spurs or growths on or around your big toe.  Your pain does not get better or it gets worse.  You have pain while resting.  You have pain in other parts of your body, such as your back, hip, or knee.  You start to limp. This information is not   intended to replace advice given to you by your health care provider. Make sure you discuss any questions you have with your health care provider. Document Released: 06/20/2005 Document Revised: 11/26/2015 Document Reviewed: 02/25/2015 Elsevier Interactive Patient Education  2019 [employer]  

## 2018-09-01 ENCOUNTER — Encounter: Payer: Self-pay | Admitting: Family Medicine

## 2018-09-11 LAB — HM MAMMOGRAPHY

## 2018-09-12 ENCOUNTER — Ambulatory Visit (INDEPENDENT_AMBULATORY_CARE_PROVIDER_SITE_OTHER): Payer: Medicare Other | Admitting: Family Medicine

## 2018-09-12 ENCOUNTER — Encounter: Payer: Self-pay | Admitting: Family Medicine

## 2018-09-12 ENCOUNTER — Other Ambulatory Visit: Payer: Self-pay

## 2018-09-12 VITALS — BP 126/74 | HR 76 | Temp 97.9°F | Ht 68.0 in | Wt 139.0 lb

## 2018-09-12 DIAGNOSIS — W57XXXA Bitten or stung by nonvenomous insect and other nonvenomous arthropods, initial encounter: Secondary | ICD-10-CM

## 2018-09-12 DIAGNOSIS — Z13220 Encounter for screening for lipoid disorders: Secondary | ICD-10-CM

## 2018-09-12 DIAGNOSIS — Z0001 Encounter for general adult medical examination with abnormal findings: Secondary | ICD-10-CM | POA: Diagnosis not present

## 2018-09-12 DIAGNOSIS — R5383 Other fatigue: Secondary | ICD-10-CM

## 2018-09-12 DIAGNOSIS — Z8349 Family history of other endocrine, nutritional and metabolic diseases: Secondary | ICD-10-CM

## 2018-09-12 DIAGNOSIS — Z23 Encounter for immunization: Secondary | ICD-10-CM

## 2018-09-12 DIAGNOSIS — Z0000 Encounter for general adult medical examination without abnormal findings: Secondary | ICD-10-CM

## 2018-09-12 DIAGNOSIS — K5900 Constipation, unspecified: Secondary | ICD-10-CM

## 2018-09-12 NOTE — Patient Instructions (Signed)
You had labs performed today.  You will be contacted with the results of the labs once they are available, usually in the next 3 business days for routine lab work.   Keep me posted about the tick bite.

## 2018-09-12 NOTE — Progress Notes (Signed)
Joanna Perkins. Joanna Perkins is a 68 y.o. female presents to office today for annual physical exam examination.    Concerns today include: 1.  Fatigue Patient reports a one-month history of fatigue, constipation and difficulty with sleep.  She denies any changes in weight.  No hematochezia, melena.  She is tolerating physical activity normally.  She had night sweats earlier this week but wonders if this may be related to the thyroid supplement she started over-the-counter last week.  She has a very strong family history of thyroid disorder.  She has been tested several times in the past but always been borderline.  She is also on kelp supplements in efforts to improve thyroid function.  Of note, she does report the passing of a dog last winter that caused some decreased mood.  However, she feels as of late she has been dealing with these emotions fairly well.  Denies any visual disturbance, difficulty swallowing, heart palpitations, change in voice.  Additionally, she adds she was bitten by a tick last month on her right inner thigh.  She had some doxycycline at home which she took for 1 week following the tick bite.  She does have a history of Lyme infection in the past.  She notes that the swelling seemed to go down but she still has a small area that seems not to be healing fully.  She wanted to have this checked out today.  Denies any target lesions.  Marital status: married, Substance use: none Diet: balanced, Exercise: regular cardio/ vigorous exercise Last colonoscopy: 07/20/2017, next 5 years Last mammogram: 08/2017 Last pap smear: negative 10/08/2014 (in media tab) Refills needed today: na Immunizations needed: PNA, Tdap  Past Medical History:  Diagnosis Date  . Celiac disease 2008   Social History   Socioeconomic History  . Marital status: Married    Spouse name: [name]  . Number of children: Not on file  . Years of education: Not on file  . Highest education level: Not on file  Occupational  History  . Not on file  Social Needs  . Financial resource strain: Not on file  . Food insecurity:    Worry: Not on file    Inability: Not on file  . Transportation needs:    Medical: Not on file    Non-medical: Not on file  Tobacco Use  . Smoking status: Former Smoker    Packs/day: 1.00    Years: 15.00    Pack years: 15.00    Types: Cigarettes    Last attempt to quit: 1987    Years since quitting: 33.2  . Smokeless tobacco: Never Used  Substance and Sexual Activity  . Alcohol use: Yes    Alcohol/week: 11.0 standard drinks    Types: 7 Glasses of wine, 4 Cans of beer per week    Comment: daily  . Drug use: No  . Sexual activity: Not on file  Lifestyle  . Physical activity:    Days per week: Not on file    Minutes per session: Not on file  . Stress: Not on file  Relationships  . Social connections:    Talks on phone: Not on file    Gets together: Not on file    Attends religious service: Not on file    Active member of club or organization: Not on file    Attends meetings of clubs or organizations: Not on file    Relationship status: Not on file  . Intimate partner violence:  Fear of current or ex partner: Not on file    Emotionally abused: Not on file    Physically abused: Not on file    Forced sexual activity: Not on file  Other Topics Concern  . 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 She recently relocated resides with her husband [name].  No biologic children but she does have one stepchild and 2 grandchildren. She prefers holistic approach to her healthcare.   Past Surgical History:  Procedure Laterality Date  . COLONOSCOPY     Family History  Problem Relation Age of Onset  . Asthma Mother   . Hypertension Mother   . COPD Mother   . Hepatitis C Father   . Breast cancer Sister   . Celiac disease Sister   . Thyroid disease Sister   . Celiac disease Brother   . Heart  attack Maternal Grandfather   . Heart attack Paternal Grandfather   . Celiac disease Sister   . Thyroid disease Sister   . Colon cancer Neg Hx   . Colon polyps Neg Hx   . Esophageal cancer Neg Hx   . Rectal cancer Neg Hx   . Stomach cancer Neg Hx     Current Outpatient Medications:  .  b complex vitamins capsule, Take 1 capsule by mouth daily., Disp: , Rfl:  .  Calcium-Magnesium-Vitamin D (CALCIUM 500 PO), Take 1 capsule by mouth daily., Disp: , Rfl:  .  Iodine, Kelp, (KELP PO), Take 1 capsule by mouth daily., Disp: , Rfl:  .  OVER THE COUNTER MEDICATION, 1 capsule daily. Bladderwreck, Disp: , Rfl:  .  TURMERIC PO, Take 1 capsule by mouth daily., Disp: , Rfl:   Current Facility-Administered Medications:  .  0.9 %  sodium chloride infusion, 500 mL, Intravenous, Once, Armbruster, Carlota Raspberry, MD  Allergies  Allergen Reactions  . Gluten Meal      ROS: Review of Systems Constitutional: negative Eyes: negative Ears, nose, mouth, throat, and face: negative Respiratory: negative Cardiovascular: negative Gastrointestinal: positive for constipation Genitourinary:negative Integument/breast: negative Hematologic/lymphatic: negative Musculoskeletal:negative Neurological: negative Behavioral/Psych: negative Endocrine: negative Allergic/Immunologic: negative    Physical exam BP 126/74   Pulse 76   Temp 97.9 F (36.6 C) (Oral)   Ht 5' 8"  (1.727 m)   Wt 139 lb (63 kg)   BMI 21.13 kg/m  General appearance: alert, cooperative, appears stated age and no distress Head: Normocephalic, without obvious abnormality, atraumatic Eyes: negative findings: lids and lashes normal, conjunctivae and sclerae normal, corneas clear and pupils equal, round, reactive to light and accomodation Ears: normal TM's and external ear canals both ears Nose: Nares normal. Septum midline. Mucosa normal. No drainage or sinus tenderness. Throat: lips, mucosa, and tongue normal; teeth and gums normal Neck: no  adenopathy, no carotid bruit, no JVD, supple, symmetrical, trachea midline and thyroid not enlarged, symmetric, no tenderness/mass/nodules Back: symmetric, no curvature. ROM normal. No CVA tenderness. Lungs: clear to auscultation bilaterally Heart: regular rate and rhythm, S1, S2 normal, no murmur, click, rub or gallop Abdomen: soft, non-tender; bowel sounds normal; no masses,  no organomegaly Extremities: extremities normal, atraumatic, no cyanosis or edema Pulses: 2+ and symmetric Skin: Small, mildly erythematous lesion noted on the right inner thigh.  No evidence of secondary infection.  No drainage. Lymph nodes: Cervical, supraclavicular, and axillary nodes normal. Neurologic: Alert and oriented X 3, normal strength and tone. Normal symmetric  reflexes. Normal coordination and gait Psych: Mood stable, speech normal, affect appropriate, pleasant, interactive.  Depression screen [facility] 2/9 09/12/2018 04/18/2017  Decreased Interest 2 0  Down, Depressed, Hopeless 1 0  PHQ - 2 Score 3 0  Altered sleeping 2 -  Tired, decreased energy 2 -  Change in appetite 0 -  Feeling bad or failure about yourself  0 -  Trouble concentrating 0 -  Moving slowly or fidgety/restless 0 -  Suicidal thoughts 0 -  PHQ-9 Score 7 -    Assessment/ Plan: Joanna Perkins. Mcpherson here for annual physical exam.   1. Annual physical exam Tetanus and PNA vaccines offered today.  2. Fatigue, unspecified type Uncertain etiology at this point.  We will perform metabolic work-up as well as thyroid lab given strong family history of thyroid disorder.  She does have a higher PHQ score than previous check.  I do wonder if she is experiencing SADD versus has reactive depression related to the passing of her dog. - CMP14+EGFR - Thyroid Panel With TSH - CBC  3. Tick bite, initial encounter Does not have signs or symptoms suggestive of Lyme infection at this time.  No evidence of secondary bacterial infection.  I question retained  foreign body in the area.  Continue to keep area clean and monitor for signs and symptoms of infection.  She will contact me if she has any concerns going forward with regards to this issue  4. Constipation, unspecified constipation type Possibly related to underlying thyroid disorder.  She maintains a healthy lifestyle.   5. Family history of thyroid disease - Thyroid Panel With TSH  6. Screening, lipid - Lipid Panel  Zoiey Christy M. Lajuana Ripple, DO

## 2018-09-12 NOTE — Addendum Note (Signed)
Addended byCarrolyn Leigh on: 09/12/2018 12:03 PM   Modules accepted: Orders

## 2018-09-13 ENCOUNTER — Encounter: Payer: Self-pay | Admitting: Family Medicine

## 2018-09-13 LAB — THYROID PANEL WITH TSH
Free Thyroxine Index: 2.2 (ref 1.2–4.9)
T3 Uptake Ratio: 39 % (ref 24–39)
T4, Total: 5.7 ug/dL (ref 4.5–12.0)
TSH: 3.86 u[IU]/mL (ref 0.450–4.500)

## 2018-09-13 LAB — CMP14+EGFR
ALT: 15 IU/L (ref 0–32)
AST: 21 IU/L (ref 0–40)
Albumin/Globulin Ratio: 2 (ref 1.2–2.2)
Albumin: 4.3 g/dL (ref 3.8–4.8)
Alkaline Phosphatase: 53 IU/L (ref 39–117)
BUN / CREAT RATIO: 15 (ref 12–28)
BUN: 14 mg/dL (ref 8–27)
Bilirubin Total: 0.5 mg/dL (ref 0.0–1.2)
CO2: 24 mmol/L (ref 20–29)
Calcium: 9.4 mg/dL (ref 8.7–10.3)
Chloride: 96 mmol/L (ref 96–106)
Creatinine, Ser: 0.96 mg/dL (ref 0.57–1.00)
GFR, EST AFRICAN AMERICAN: 71 mL/min/{1.73_m2} (ref >59)
GFR, EST NON AFRICAN AMERICAN: 62 mL/min/{1.73_m2} (ref >59)
GLOBULIN, TOTAL: 2.2 g/dL (ref 1.5–4.5)
GLUCOSE: 60 mg/dL — AB (ref 65–99)
Potassium: 4.1 mmol/L (ref 3.5–5.2)
SODIUM: 137 mmol/L (ref 134–144)
Total Protein: 6.5 g/dL (ref 6.0–8.5)

## 2018-09-13 LAB — CBC
HEMATOCRIT: 37.5 % (ref 34.0–46.6)
Hemoglobin: 12.8 g/dL (ref 11.1–15.9)
MCH: 30.5 pg (ref 26.6–33.0)
MCHC: 34.1 g/dL (ref 31.5–35.7)
MCV: 90 fL (ref 79–97)
Platelets: 244 10*3/uL (ref 150–450)
RBC: 4.19 x10E6/uL (ref 3.77–5.28)
RDW: 12.6 % (ref 11.7–15.4)
WBC: 5.3 10*3/uL (ref 3.4–10.8)

## 2018-09-13 LAB — LIPID PANEL
Chol/HDL Ratio: 1.9 ratio (ref 0.0–4.4)
Cholesterol, Total: 202 mg/dL — ABNORMAL HIGH (ref 100–199)
HDL: 105 mg/dL (ref >39)
LDL Calculated: 89 mg/dL (ref 0–99)
TRIGLYCERIDES: 40 mg/dL (ref 0–149)
VLDL Cholesterol Cal: 8 mg/dL (ref 5–40)

## 2018-09-14 ENCOUNTER — Other Ambulatory Visit: Payer: Self-pay | Admitting: Family Medicine

## 2018-09-14 DIAGNOSIS — R5383 Other fatigue: Secondary | ICD-10-CM

## 2018-10-08 ENCOUNTER — Other Ambulatory Visit: Payer: Self-pay | Admitting: Family Medicine

## 2018-10-08 ENCOUNTER — Encounter: Payer: Self-pay | Admitting: Family Medicine

## 2018-10-08 MED ORDER — DOXYCYCLINE HYCLATE 100 MG PO TABS: 100.0000 mg | ORAL_TABLET | Freq: Two times a day (BID) | ORAL | 0 refills | Status: AC

## 2018-11-21 ENCOUNTER — Telehealth: Payer: Self-pay | Admitting: Family Medicine

## 2018-12-05 ENCOUNTER — Encounter: Payer: Self-pay | Admitting: Family Medicine

## 2018-12-05 ENCOUNTER — Other Ambulatory Visit: Payer: Self-pay | Admitting: Family Medicine

## 2018-12-05 DIAGNOSIS — W57XXXA Bitten or stung by nonvenomous insect and other nonvenomous arthropods, initial encounter: Secondary | ICD-10-CM

## 2018-12-05 MED ORDER — DOXYCYCLINE HYCLATE 100 MG PO TABS: 100.0000 mg | ORAL_TABLET | Freq: Two times a day (BID) | ORAL | 0 refills | Status: DC

## 2019-01-07 ENCOUNTER — Encounter: Payer: Self-pay | Admitting: Family Medicine

## 2019-01-07 ENCOUNTER — Other Ambulatory Visit: Payer: Self-pay | Admitting: Family Medicine

## 2019-01-08 ENCOUNTER — Other Ambulatory Visit: Payer: Self-pay | Admitting: Family Medicine

## 2019-01-08 DIAGNOSIS — M79674 Pain in right toe(s): Secondary | ICD-10-CM

## 2019-01-15 ENCOUNTER — Other Ambulatory Visit: Payer: Self-pay

## 2019-01-15 ENCOUNTER — Encounter: Payer: Self-pay | Admitting: Sports Medicine

## 2019-01-15 ENCOUNTER — Other Ambulatory Visit: Payer: Medicare Other

## 2019-01-15 ENCOUNTER — Ambulatory Visit (INDEPENDENT_AMBULATORY_CARE_PROVIDER_SITE_OTHER): Payer: Medicare Other | Admitting: Sports Medicine

## 2019-01-15 VITALS — BP 122/72 | Ht 67.0 in | Wt 135.0 lb

## 2019-01-15 DIAGNOSIS — M19071 Primary osteoarthritis, right ankle and foot: Secondary | ICD-10-CM

## 2019-01-15 DIAGNOSIS — R5383 Other fatigue: Secondary | ICD-10-CM

## 2019-01-16 ENCOUNTER — Encounter: Payer: Self-pay | Admitting: Family Medicine

## 2019-01-16 ENCOUNTER — Encounter: Payer: Self-pay | Admitting: Sports Medicine

## 2019-01-16 LAB — TSH: TSH: 1.83 u[IU]/mL (ref 0.450–4.500)

## 2019-01-16 NOTE — Progress Notes (Signed)
   Subjective:    Patient ID: Joanna Perkins. Yaeger, female    DOB: 09-15-1950, 68 y.o.   MRN: [id number]  HPI chief complaint: Right great toe pain  Very pleasant 68 year old female comes in today complaining of 6 months of right great toe pain.  She denies any specific injury but believes that her pain may have begun as a result of pulling her dog on a leash.  She endorses pain at the first MTP joint on the right foot.  She was seen by podiatrist back in February and x-rays were ordered which are available for my review.  Patient was told that surgery was her only option.  She declined at that time.  She is here today for a second opinion.  Her pain is worse with walking.  She states that she is starting to walk with a limp and it is affecting her left hip.  She has noticed that ice as well as Triflora, a topical homeopathic agent, is helpful.  She also notices that massaging the plantar aspect of her foot helps some.  She denies any previous injury to the right great toe but she does have a history of plantar fasciitis which was treated with orthotics many years ago with great success.  She states that those orthotics have been wonderful but are starting to wear thin.  Unfortunately, she did not bring them with her today.  Past medical history reviewed.  It is significant for celiac disease diagnosed in 2008.  She also has a history of Lyme disease which was diagnosed in 1998 and she will have occasional flares requiring antibiotics. Current medications reviewed Allergies reviewed   Review of Systems    As above Objective:   Physical Exam  Well-developed, well-nourished.  No acute distress.  Awake alert and oriented x3.  Vital signs reviewed.  Examination of the right foot with attention to the right great toe shows mild hypertrophy of the joint.  Hallux rigidus with passive range of motion.  Patient is tender to palpation along the lateralmost aspect of the joint.  No erythema.  No effusion.  Brisk  capillary refill.  No tenderness to palpation along the first metatarsal.  Good pulses.  Walking with a slight limp.  X-rays of her right foot from February including AP, lateral, and oblique views are reviewed.  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  These changes are most severe on the oblique view.  Nothing acute seen.      Assessment & Plan:   Right great toe pain secondary to moderately advanced osteoarthritis of the first MTP joint  Recommended that the patient return to the office next week with her walking shoes and boots.  She will also bring her old orthotic.  We will plan on making her a new custom orthotic with a first ray post to help limit mobility at the first MTP joint.  I did discuss cortisone injections as a temporary treatment as well we are going to wait on that for now.  Patient is not interested in surgery at this time.  I will see her again sometime in the next week or 2 for her custom orthotics.  In the meantime, I have encouraged her to continue with her topical homeopathic treatment and ice as needed since these are helpful.  This note was dictated using Dragon naturally speaking software and may contain errors in syntax, spelling, or content which have not been identified prior to signing this note.

## 2019-01-22 ENCOUNTER — Ambulatory Visit: Payer: Medicare Other | Admitting: Sports Medicine

## 2019-01-22 ENCOUNTER — Encounter: Payer: Self-pay | Admitting: Sports Medicine

## 2019-01-22 ENCOUNTER — Other Ambulatory Visit: Payer: Self-pay

## 2019-01-22 VITALS — BP 125/71 | Ht 67.0 in | Wt 135.0 lb

## 2019-01-22 DIAGNOSIS — M19071 Primary osteoarthritis, right ankle and foot: Secondary | ICD-10-CM

## 2019-01-22 NOTE — Progress Notes (Signed)
Ms. Huffine presented for orthotic construction today.  Please see note from January 15, 2019 for review of systems and physical exam findings for osteoarthritis of the first MTP joint.  BP 125/71   Ht 5\' 7"  (1.702 m)   Wt 135 lb (61.2 kg)   BMI 21.14 kg/m   Procedure Patient was fitted for a standard, cushioned, semi-rigid orthotic.  The orthotic was heated and the patient stood on the orthotic blank positioned on the orthotic stand. The patient was positioned in subtalar neutral position and 10 degrees of ankle dorsiflexion in a weight bearing stance. After molding, a stable Fast-Tech EVA base was applied to the orthotic blank.   The blank was ground to a stable position for weight bearing. Size: 8 base: Blue EVA posting: R 1st ray additional orthotic padding: none  We discussed future treatment plan if this does not help resolve her pain, such as corticosteroid injection under ultrasound guidance.  Continue to ice daily.  Recommended buying Voltaren gel over-the-counter.  Follow-up as needed.  Lanier Clam, DO, AT[facility] Sports Medicine Fellow  Patient seen and evaluated with the sports medicine fellow.  I agree with the above plan of care.  Patient presents today for custom orthotics.  Orthotics were constructed as above.  Patient found them to be comfortable prior to leaving the office.  If she continues to have pain then we could consider cortisone injection into the first MTP joint.  Follow-up as needed.

## 2019-05-14 ENCOUNTER — Other Ambulatory Visit: Payer: Self-pay

## 2019-05-14 ENCOUNTER — Ambulatory Visit: Payer: Medicare Other

## 2019-05-15 ENCOUNTER — Ambulatory Visit (INDEPENDENT_AMBULATORY_CARE_PROVIDER_SITE_OTHER): Payer: Medicare Other

## 2019-05-15 DIAGNOSIS — Z23 Encounter for immunization: Secondary | ICD-10-CM

## 2019-07-31 ENCOUNTER — Encounter: Payer: Self-pay | Admitting: Family Medicine

## 2019-08-13 ENCOUNTER — Encounter: Payer: Self-pay | Admitting: Family Medicine

## 2019-08-23 ENCOUNTER — Ambulatory Visit (INDEPENDENT_AMBULATORY_CARE_PROVIDER_SITE_OTHER): Payer: Medicare PPO | Admitting: Family Medicine

## 2019-08-23 ENCOUNTER — Encounter: Payer: Self-pay | Admitting: Family Medicine

## 2019-08-23 DIAGNOSIS — M8589 Other specified disorders of bone density and structure, multiple sites: Secondary | ICD-10-CM

## 2019-08-23 DIAGNOSIS — M2550 Pain in unspecified joint: Secondary | ICD-10-CM | POA: Diagnosis not present

## 2019-08-23 DIAGNOSIS — Z1159 Encounter for screening for other viral diseases: Secondary | ICD-10-CM

## 2019-08-23 DIAGNOSIS — R5383 Other fatigue: Secondary | ICD-10-CM | POA: Diagnosis not present

## 2019-08-23 DIAGNOSIS — R413 Other amnesia: Secondary | ICD-10-CM | POA: Diagnosis not present

## 2019-08-23 DIAGNOSIS — R4589 Other symptoms and signs involving emotional state: Secondary | ICD-10-CM

## 2019-08-23 NOTE — Progress Notes (Signed)
Telephone visit  Subjective: CC: memory changes, moodiness, joint pain PCP: [name], DO Joanna Perkins is a 69 y.o. female calls for telephone consult today. Patient provides verbal consent for consult held via phone.  Due to COVID-19 pandemic this visit was conducted virtually. This visit type was conducted due to national recommendations for restrictions regarding the COVID-19 Pandemic (e.g. social distancing, sheltering in place) in an effort to limit this patient's exposure and mitigate transmission in our community. All issues noted in this document were discussed and addressed.  A physical exam was not performed with this format.   Location of patient: home  Location of provider: Working remotely from home Others present for call: husband, [name]  1. Weird symptoms/ ?thyroid issue She has been doing the homeopathy for her Lyme flare since Thanksgiving, which typically works well.  She has been resting, exercising and eating well but symptoms are not getting better.  She reports short term memory has gotten really bad lately.  She sometimes cannot even recall her dog's name/ feeding the dog.  She has sometimes gotten lost (not know where she is in proximity to other things).  She is trying to do more things to make sure she does not forget stuff (phone reminders).  Symptoms are worse over the last 2-3 months.    Moodiness has been worse over the last year or so.  She reports easy frustration/ easily angered.  She notes that sometimes out of the blue she will "tank" and feel terrible and down.  There is not consistency to why her mood changes so abruptly.  She initially thought this might be due to social alcohol consumption but symptoms are occurring outside of this now.  She reports polyarthralgia that seems to be sudden onset that is different that her normal toe/ hip pain.  She almost feels like it's body aches like when she gets sick.  She reports energy waxes and wanes  (hyper, sometimes hard to calm down and focus, and low energy levels).  She reports her maternal aunt had Alzheimer's (developed in older years).  Another maternal aunt "went crazy" in her 90s, no formal mental health diagnosis.  Both her parents passed away early (asthma).  Strong family history of celiac disease.  Both sisters has thyroid disorder.  There is some type of congenital muscular disorder in her sister that was treated with steroids as a child and resolved.  She thinks that this was a precursor to MS.  ROS: Per HPI  Allergies  Allergen Reactions  . Gluten Meal    Past Medical History:  Diagnosis Date  . Celiac disease 2008    Current Outpatient Medications:  .  b complex vitamins capsule, Take 1 capsule by mouth daily., Disp: , Rfl:  .  Calcium-Magnesium-Vitamin D (CALCIUM 500 PO), Take 1 capsule by mouth daily., Disp: , Rfl:  .  doxycycline (VIBRA-TABS) 100 MG tablet, Take 1 tablet (100 mg total) by mouth 2 (two) times daily., Disp: 20 tablet, Rfl: 0 .  Iodine, Kelp, (KELP PO), Take 1 capsule by mouth daily., Disp: , Rfl:  .  OVER THE COUNTER MEDICATION, 1 capsule daily. Bladderwreck, Disp: , Rfl:  .  TURMERIC PO, Take 1 capsule by mouth daily., Disp: , Rfl:   Assessment/ Plan: 69 y.o. female   1. Lack of energy Will evaluate for metabolic etiology. Strong family history of thyroid disorder. - Thyroid Panel With TSH; Future - Vitamin B12; Future - CBC; Future  2. Memory change  Uncertain etiology.  Will perform work-up.  May need to consider MRI of brain and formal MMSE in office.  We also discussed potential need for referral to neurology.  Will await results before making plan - Vitamin B12; Future - CBC; Future - RPR; Future - HIV antibody (with reflex); Future  3. Moodiness - Thyroid Panel With TSH; Future  4. Osteopenia of multiple sites - Thyroid Panel With TSH; Future - CMP14+EGFR; Future - VITAMIN D 25 Hydroxy (Vit-D Deficiency, Fractures);  Future  5. Encounter for hepatitis C screening test for low risk patient - Hepatitis C antibody; Future  6. Polyarthralgia - ANA w/Reflex if Positive; Future - C-reactive protein; Future - Sedimentation Rate; Future   Start time: 9:39am (LVM); 10:17am End time: 10:38am  Total time spent on patient care (including telephone call/ virtual visit): 31 minutes  [place], [place] [phone]

## 2019-08-26 ENCOUNTER — Other Ambulatory Visit: Payer: Medicare PPO

## 2019-08-26 ENCOUNTER — Other Ambulatory Visit: Payer: Self-pay

## 2019-08-26 DIAGNOSIS — M2550 Pain in unspecified joint: Secondary | ICD-10-CM

## 2019-08-26 DIAGNOSIS — Z1159 Encounter for screening for other viral diseases: Secondary | ICD-10-CM

## 2019-08-26 DIAGNOSIS — R413 Other amnesia: Secondary | ICD-10-CM

## 2019-08-26 DIAGNOSIS — R4589 Other symptoms and signs involving emotional state: Secondary | ICD-10-CM

## 2019-08-26 DIAGNOSIS — R5383 Other fatigue: Secondary | ICD-10-CM

## 2019-08-26 DIAGNOSIS — M8589 Other specified disorders of bone density and structure, multiple sites: Secondary | ICD-10-CM

## 2019-08-27 ENCOUNTER — Other Ambulatory Visit: Payer: Self-pay | Admitting: Family Medicine

## 2019-08-27 DIAGNOSIS — R5383 Other fatigue: Secondary | ICD-10-CM

## 2019-08-27 DIAGNOSIS — R768 Other specified abnormal immunological findings in serum: Secondary | ICD-10-CM

## 2019-08-27 DIAGNOSIS — M2550 Pain in unspecified joint: Secondary | ICD-10-CM

## 2019-08-27 LAB — CBC
Hematocrit: 39.6 % (ref 34.0–46.6)
Hemoglobin: 13.3 g/dL (ref 11.1–15.9)
MCH: 30.6 pg (ref 26.6–33.0)
MCHC: 33.6 g/dL (ref 31.5–35.7)
MCV: 91 fL (ref 79–97)
Platelets: 238 10*3/uL (ref 150–450)
RBC: 4.35 x10E6/uL (ref 3.77–5.28)
RDW: 12.3 % (ref 11.7–15.4)
WBC: 5.4 10*3/uL (ref 3.4–10.8)

## 2019-08-27 LAB — ANA W/REFLEX IF POSITIVE
Anti JO-1: <0.2 AI (ref 0.0–0.9)
Anti Nuclear Antibody (ANA): POSITIVE — AB
Centromere Ab Screen: <0.2 AI (ref 0.0–0.9)
Chromatin Ab SerPl-aCnc: <0.2 AI (ref 0.0–0.9)
ENA RNP Ab: 2 AI — ABNORMAL HIGH (ref 0.0–0.9)
ENA SM Ab Ser-aCnc: <0.2 AI (ref 0.0–0.9)
ENA SSA (RO) Ab: <0.2 AI (ref 0.0–0.9)
ENA SSB (LA) Ab: <0.2 AI (ref 0.0–0.9)
Scleroderma (Scl-70) (ENA) Antibody, IgG: <0.2 AI (ref 0.0–0.9)
dsDNA Ab: 1 IU/mL (ref 0–9)

## 2019-08-27 LAB — C-REACTIVE PROTEIN: CRP: <1 mg/L (ref 0–10)

## 2019-08-27 LAB — THYROID PANEL WITH TSH
Free Thyroxine Index: 2.7 (ref 1.2–4.9)
T3 Uptake Ratio: 41 % — ABNORMAL HIGH (ref 24–39)
T4, Total: 6.7 ug/dL (ref 4.5–12.0)
TSH: 1.87 u[IU]/mL (ref 0.450–4.500)

## 2019-08-27 LAB — CMP14+EGFR
ALT: 14 IU/L (ref 0–32)
AST: 23 IU/L (ref 0–40)
Albumin/Globulin Ratio: 1.8 (ref 1.2–2.2)
Albumin: 4.3 g/dL (ref 3.8–4.8)
Alkaline Phosphatase: 56 IU/L (ref 39–117)
BUN/Creatinine Ratio: 14 (ref 12–28)
BUN: 13 mg/dL (ref 8–27)
Bilirubin Total: 0.5 mg/dL (ref 0.0–1.2)
CO2: 25 mmol/L (ref 20–29)
Calcium: 9.6 mg/dL (ref 8.7–10.3)
Chloride: 102 mmol/L (ref 96–106)
Creatinine, Ser: 0.96 mg/dL (ref 0.57–1.00)
GFR calc Af Amer: 71 mL/min/{1.73_m2} (ref >59)
GFR calc non Af Amer: 61 mL/min/{1.73_m2} (ref >59)
Globulin, Total: 2.4 g/dL (ref 1.5–4.5)
Glucose: 67 mg/dL (ref 65–99)
Potassium: 3.8 mmol/L (ref 3.5–5.2)
Sodium: 140 mmol/L (ref 134–144)
Total Protein: 6.7 g/dL (ref 6.0–8.5)

## 2019-08-27 LAB — SEDIMENTATION RATE: Sed Rate: 8 mm/hr (ref 0–40)

## 2019-08-27 LAB — HIV ANTIBODY (ROUTINE TESTING W REFLEX): HIV Screen 4th Generation wRfx: NONREACTIVE

## 2019-08-27 LAB — RPR: RPR Ser Ql: NONREACTIVE

## 2019-08-27 LAB — VITAMIN B12: Vitamin B-12: 784 pg/mL (ref 232–1245)

## 2019-08-27 LAB — VITAMIN D 25 HYDROXY (VIT D DEFICIENCY, FRACTURES): Vit D, 25-Hydroxy: 53.7 ng/mL (ref 30.0–100.0)

## 2019-08-27 LAB — HEPATITIS C ANTIBODY: Hep C Virus Ab: 0.2 s/co ratio (ref 0.0–0.9)

## 2019-08-28 ENCOUNTER — Encounter: Payer: Self-pay | Admitting: Family Medicine

## 2019-09-17 DIAGNOSIS — R5383 Other fatigue: Secondary | ICD-10-CM | POA: Diagnosis not present

## 2019-09-17 DIAGNOSIS — R768 Other specified abnormal immunological findings in serum: Secondary | ICD-10-CM | POA: Diagnosis not present

## 2019-09-17 DIAGNOSIS — R519 Headache, unspecified: Secondary | ICD-10-CM | POA: Diagnosis not present

## 2019-09-17 DIAGNOSIS — M791 Myalgia, unspecified site: Secondary | ICD-10-CM | POA: Diagnosis not present

## 2019-09-17 DIAGNOSIS — R6889 Other general symptoms and signs: Secondary | ICD-10-CM | POA: Diagnosis not present

## 2019-09-18 ENCOUNTER — Encounter: Payer: Self-pay | Admitting: Family Medicine

## 2019-09-20 ENCOUNTER — Encounter: Payer: Self-pay | Admitting: Family Medicine

## 2019-10-09 ENCOUNTER — Encounter: Payer: Self-pay | Admitting: Family Medicine

## 2019-10-24 ENCOUNTER — Encounter: Payer: Self-pay | Admitting: Family Medicine

## 2019-11-03 ENCOUNTER — Encounter: Payer: Self-pay | Admitting: Family Medicine

## 2019-11-24 ENCOUNTER — Encounter: Payer: Self-pay | Admitting: Family Medicine

## 2019-11-28 ENCOUNTER — Encounter: Payer: Self-pay | Admitting: Family Medicine

## 2019-11-29 ENCOUNTER — Other Ambulatory Visit: Payer: Self-pay | Admitting: Family Medicine

## 2019-11-29 DIAGNOSIS — W57XXXA Bitten or stung by nonvenomous insect and other nonvenomous arthropods, initial encounter: Secondary | ICD-10-CM

## 2019-11-29 MED ORDER — DOXYCYCLINE HYCLATE 100 MG PO TABS: ORAL_TABLET | ORAL | 0 refills | Status: DC

## 2019-12-30 ENCOUNTER — Ambulatory Visit: Payer: Medicare PPO | Admitting: Family Medicine

## 2019-12-30 ENCOUNTER — Encounter: Payer: Self-pay | Admitting: Family Medicine

## 2019-12-30 ENCOUNTER — Other Ambulatory Visit: Payer: Self-pay

## 2019-12-30 VITALS — BP 107/59 | HR 77 | Temp 97.4°F | Ht 67.0 in | Wt 133.2 lb

## 2019-12-30 DIAGNOSIS — F5101 Primary insomnia: Secondary | ICD-10-CM

## 2019-12-30 DIAGNOSIS — K5901 Slow transit constipation: Secondary | ICD-10-CM | POA: Diagnosis not present

## 2019-12-30 MED ORDER — TRAZODONE HCL 50 MG PO TABS: 75.0000 mg | ORAL_TABLET | Freq: Every evening | ORAL | 1 refills | Status: DC | PRN

## 2019-12-30 NOTE — Progress Notes (Signed)
Subjective: CC: Follow-up fatigue, insomnia  PCP: [name], DO Joanna Perkins is a 69 y.o. female presenting to clinic today for:  1.  Fatigue/mood disturbance Patient reports that the symptoms had improved quite a bit after she got her dental implants removed.  She noted within 48 hours of removal symptoms improved substantially.  She has slowly but surely been improving since.  She has been working out in efforts to "reset her hormones".  She still feels that she has quite a bit of imbalance with the cortisol and adrenaline.  She does cite that at bedtime she feels too wound up to rest and used some of her husband's trazodone and Valium.  This did seem to help.  She would like to go ahead and get a prescription of the trazodone for her own use.  She did require full 50 mg to get to sleep but does note that she wakes up around 2 AM and sometimes has to use an additional half tablet.  She is also been seeing a chiropractor, Florene Route in [place], intermittently which has helped quite a bit with her mobility and workouts.  She does report that her bowel habits have not been as regular as they normally are.  She has a bowel movement maybe every 2 to 3 days.  She had a little bit of straining that resulted in possibly an early hemorrhoid but this improved.  Denies any rectal bleeding, nausea, vomiting, early satiety.  She continues to eat a high-fiber diet with plenty of vegetables and adequate water intake.  She is physically active as above. ROS: Per HPI  Allergies  Allergen Reactions  . Gluten Meal    Past Medical History:  Diagnosis Date  . Celiac disease 2008    Current Outpatient Medications:  .  b complex vitamins capsule, Take 1 capsule by mouth daily., Disp: , Rfl:  .  Calcium-Magnesium-Vitamin D (CALCIUM 500 PO), Take 1 capsule by mouth daily., Disp: , Rfl:  .  OVER THE COUNTER MEDICATION, 1 capsule daily. Bladderwreck, Disp: , Rfl:  .  TURMERIC PO, Take  1 capsule by mouth daily., Disp: , Rfl:  .  Iodine, Kelp, (KELP PO), Take 1 capsule by mouth daily., Disp: , Rfl:  Social History   Socioeconomic History  . Marital status: Married    Spouse name: [name]  . Number of children: Not on file  . Years of education: Not on file  . Highest education level: Not on file  Occupational History  . Not on file  Tobacco Use  . Smoking status: Former Smoker    Packs/day: 1.00    Years: 15.00    Pack years: 15.00    Types: Cigarettes    Quit date: 1987    Years since quitting: 34.5  . Smokeless tobacco: Never Used  Vaping Use  . Vaping Use: Never used  Substance and Sexual Activity  . Alcohol use: Yes    Alcohol/week: 11.0 standard drinks    Types: 7 Glasses of wine, 4 Cans of beer per week    Comment: daily  . Drug use: No  . Sexual activity: Not on file  Other Topics Concern  . 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 She recently relocated resides with her husband [name].  No biologic children but she does have one stepchild and 2 grandchildren. She prefers holistic approach to  her healthcare.   Social Determinants of Health   Financial Resource Strain:   . Difficulty of Paying Living Expenses:   Food Insecurity:   . Worried About Charity fundraiser in the Last Year:   . Arboriculturist in the Last Year:   Transportation Needs:   . Film/video editor (Medical):   Marland Kitchen Lack of Transportation (Non-Medical):   Physical Activity:   . Days of Exercise per Week:   . Minutes of Exercise per Session:   Stress:   . Feeling of Stress :   Social Connections:   . Frequency of Communication with Friends and Family:   . Frequency of Social Gatherings with Friends and Family:   . Attends Religious Services:   . Active Member of Clubs or Organizations:   . Attends Archivist Meetings:   Marland Kitchen Marital Status:   Intimate Partner Violence:   . Fear of  Current or Ex-Partner:   . Emotionally Abused:   Marland Kitchen Physically Abused:   . Sexually Abused:    Family History  Problem Relation Age of Onset  . Asthma Mother   . Hypertension Mother   . COPD Mother   . Hepatitis C Father   . Breast cancer Sister   . Celiac disease Sister   . Thyroid disease Sister   . Celiac disease Brother   . Heart attack Maternal Grandfather   . Heart attack Paternal Grandfather   . Celiac disease Sister   . Thyroid disease Sister   . Colon cancer Neg Hx   . Colon polyps Neg Hx   . Esophageal cancer Neg Hx   . Rectal cancer Neg Hx   . Stomach cancer Neg Hx     Objective: Office vital signs reviewed. BP (!) 107/59   Pulse 77   Temp (!) 97.4 F (36.3 C)   Ht 5' 7"  (1.702 m)   Wt 133 lb 3.2 oz (60.4 kg)   SpO2 98%   BMI 20.86 kg/m   Physical Examination:  General: Awake, alert, well nourished, No acute distress HEENT: Normal, sclera [place], MMM Cardio: regular rate and rhythm, S1S2 heard, no murmurs appreciated Pulm: clear to auscultation bilaterally, no wheezes, rhonchi or rales; normal work of breathing on room air GI: soft, non-tender, non-distended, bowel sounds present x4, no hepatomegaly, no splenomegaly, no masses Psych: Mood stable, speech normal, affect appropriate, pleasant and interactive Depression screen [facility] 2/9 12/30/2019 09/12/2018 04/18/2017  Decreased Interest 0 2 0  Down, Depressed, Hopeless 0 1 0  PHQ - 2 Score 0 3 0  Altered sleeping - 2 -  Tired, decreased energy - 2 -  Change in appetite - 0 -  Feeling bad or failure about yourself  - 0 -  Trouble concentrating - 0 -  Moving slowly or fidgety/restless - 0 -  Suicidal thoughts - 0 -  PHQ-9 Score - 7 -   No flowsheet data found.  Assessment/ Plan: 69 y.o. female   1. Primary insomnia I have given her own prescription of trazodone encouraged her to go to 75 mg at bedtime.  Hopefully this will improve the intermittent disturbances in sleep.  If not she can go up to 100 mg.   She may follow-up in 6 months, sooner as needed.  Advised against use of Valium. - traZODone (DESYREL) 50 MG tablet; Take 1.5-2 tablets (75-100 mg total) by mouth at bedtime as needed for sleep.  Dispense: 180 tablet; Refill: 1  2. Slow transit constipation  Since she is not really symptomatic from this, no big changes needed.  However, we discussed if she gets recurrent hemorrhoid, bleeding, other symptoms we may need to consider medication.  Briefly discussed consideration for Linzess or Trulance.  However, she is motivated to stay off medication if possible which I think is totally reasonable.  Continue good lifestyle choices.   No orders of the defined types were placed in this encounter.  No orders of the defined types were placed in this encounter.    [name], DO [place] [phone]

## 2019-12-31 ENCOUNTER — Encounter: Payer: Self-pay | Admitting: Family Medicine

## 2020-02-03 ENCOUNTER — Encounter: Payer: Self-pay | Admitting: Family Medicine

## 2020-02-03 ENCOUNTER — Other Ambulatory Visit: Payer: Self-pay | Admitting: Family Medicine

## 2020-02-03 DIAGNOSIS — W57XXXD Bitten or stung by nonvenomous insect and other nonvenomous arthropods, subsequent encounter: Secondary | ICD-10-CM

## 2020-02-07 ENCOUNTER — Other Ambulatory Visit: Payer: Self-pay

## 2020-02-07 ENCOUNTER — Other Ambulatory Visit: Payer: Medicare PPO

## 2020-02-10 ENCOUNTER — Other Ambulatory Visit: Payer: Self-pay

## 2020-02-10 ENCOUNTER — Encounter: Payer: Self-pay | Admitting: Family Medicine

## 2020-02-10 ENCOUNTER — Other Ambulatory Visit: Payer: Self-pay | Admitting: Family Medicine

## 2020-02-10 DIAGNOSIS — A6004 Herpesviral vulvovaginitis: Secondary | ICD-10-CM

## 2020-02-10 MED ORDER — VALACYCLOVIR HCL 1 G PO TABS: 1000.0000 mg | ORAL_TABLET | Freq: Every day | ORAL | 1 refills | Status: AC

## 2020-02-10 MED ORDER — VALACYCLOVIR HCL 1 G PO TABS: 1000.0000 mg | ORAL_TABLET | Freq: Every day | ORAL | 1 refills | Status: DC

## 2020-02-10 NOTE — Telephone Encounter (Signed)
Pt would the medication to be resent but to [facility].

## 2020-02-14 ENCOUNTER — Encounter: Payer: Self-pay | Admitting: Family Medicine

## 2020-02-14 LAB — ALPHA-GAL PANEL
Alpha Gal IgE*: 0.18 kU/L — ABNORMAL HIGH (ref <0.10)
Beef (Bos spp) IgE: <0.1 kU/L (ref <0.35)
Class Interpretation: 0
Class Interpretation: 0
Class Interpretation: 0
Lamb/Mutton (Ovis spp) IgE: <0.1 kU/L (ref <0.35)
Pork (Sus spp) IgE: <0.1 kU/L (ref <0.35)

## 2020-03-08 ENCOUNTER — Encounter: Payer: Self-pay | Admitting: Family Medicine

## 2020-03-10 NOTE — Telephone Encounter (Signed)
Note on med list please advise?

## 2020-03-10 NOTE — Telephone Encounter (Signed)
Appt set up for 09/27

## 2020-03-24 ENCOUNTER — Encounter: Payer: Self-pay | Admitting: Family Medicine

## 2020-03-30 ENCOUNTER — Ambulatory Visit: Payer: Medicare PPO | Admitting: Family Medicine

## 2020-03-31 ENCOUNTER — Ambulatory Visit (INDEPENDENT_AMBULATORY_CARE_PROVIDER_SITE_OTHER): Payer: Medicare PPO | Admitting: Family Medicine

## 2020-03-31 ENCOUNTER — Other Ambulatory Visit: Payer: Self-pay

## 2020-03-31 ENCOUNTER — Encounter: Payer: Self-pay | Admitting: Family Medicine

## 2020-03-31 VITALS — BP 124/70 | HR 82 | Temp 97.8°F | Ht 67.0 in | Wt 133.0 lb

## 2020-03-31 DIAGNOSIS — Z23 Encounter for immunization: Secondary | ICD-10-CM

## 2020-03-31 DIAGNOSIS — A6004 Herpesviral vulvovaginitis: Secondary | ICD-10-CM

## 2020-03-31 MED ORDER — VALACYCLOVIR HCL 1 G PO TABS: 1000.0000 mg | ORAL_TABLET | Freq: Every day | ORAL | 0 refills | Status: DC

## 2020-03-31 NOTE — Progress Notes (Signed)
Subjective: CC:? Herpes outbreak PCP: [name], DO [id number] M. Hickling is a 69 y.o. female presenting to clinic today for:  1.  Herpes outbreak Patient reports that she is now totally asymptomatic status post treatment with Valtrex.  Overall she has been feeling the best that she has felt in quite some time.  In last 10 days she had no flares of any of her ailments which include celiac, Lyme's and intolerance to the previous dental implant she had.  She feels well.  She is been avoiding alcohol as this seems to exacerbate symptoms.  She continues to maintain a healthy lifestyle and avoid gluten.  Her chronic left-sided hip pain has been under good control with the help of her chiropractor in [place].   ROS: Per HPI  Allergies  Allergen Reactions  . Gluten Meal    Past Medical History:  Diagnosis Date  . Celiac disease 2008    Current Outpatient Medications:  .  b complex vitamins capsule, Take 1 capsule by mouth daily., Disp: , Rfl:  .  Calcium-Magnesium-Vitamin D (CALCIUM 500 PO), Take 1 capsule by mouth daily., Disp: , Rfl:  .  Iodine, Kelp, (KELP PO), Take 1 capsule by mouth daily., Disp: , Rfl:  .  OVER THE COUNTER MEDICATION, 1 capsule daily. Bladderwreck, Disp: , Rfl:  .  traZODone (DESYREL) 50 MG tablet, Take 1.5-2 tablets (75-100 mg total) by mouth at bedtime as needed for sleep., Disp: 180 tablet, Rfl: 1 .  TURMERIC PO, Take 1 capsule by mouth daily., Disp: , Rfl:  Social History   Socioeconomic History  . Marital status: Married    Spouse name: [name]  . Number of children: Not on file  . Years of education: Not on file  . Highest education level: Not on file  Occupational History  . Not on file  Tobacco Use  . Smoking status: Former Smoker    Packs/day: 1.00    Years: 15.00    Pack years: 15.00    Types: Cigarettes    Quit date: 1987    Years since quitting: 34.7  . Smokeless tobacco: Never Used  Vaping Use  . Vaping Use: Never used   Substance and Sexual Activity  . Alcohol use: Yes    Alcohol/week: 11.0 standard drinks    Types: 7 Glasses of wine, 4 Cans of beer per week    Comment: daily  . Drug use: No  . Sexual activity: Not on file  Other Topics Concern  . 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 She recently relocated resides with her husband [name].  No biologic children but she does have one stepchild and 2 grandchildren. She prefers holistic approach to her healthcare.   Social Determinants of Health   Financial Resource Strain:   . Difficulty of Paying Living Expenses: Not on file  Food Insecurity:   . Worried About Charity fundraiser in the Last Year: Not on file  . Ran Out of Food in the Last Year: Not on file  Transportation Needs:   . Lack of Transportation (Medical): Not on file  . Lack of Transportation (Non-Medical): Not on file  Physical Activity:   . Days of Exercise per Week: Not on file  . Minutes of Exercise per Session: Not on file  Stress:   . Feeling of Stress : Not on file  Social Connections:   .  Frequency of Communication with Friends and Family: Not on file  . Frequency of Social Gatherings with Friends and Family: Not on file  . Attends Religious Services: Not on file  . Active Member of Clubs or Organizations: Not on file  . Attends Archivist Meetings: Not on file  . Marital Status: Not on file  Intimate Partner Violence:   . Fear of Current or Ex-Partner: Not on file  . Emotionally Abused: Not on file  . Physically Abused: Not on file  . Sexually Abused: Not on file   Family History  Problem Relation Age of Onset  . Asthma Mother   . Hypertension Mother   . COPD Mother   . Hepatitis C Father   . Breast cancer Sister   . Celiac disease Sister   . Thyroid disease Sister   . Celiac disease Brother   . Heart attack Maternal Grandfather   . Heart attack Paternal Grandfather    . Celiac disease Sister   . Thyroid disease Sister   . Colon cancer Neg Hx   . Colon polyps Neg Hx   . Esophageal cancer Neg Hx   . Rectal cancer Neg Hx   . Stomach cancer Neg Hx     Objective: Office vital signs reviewed. BP 124/70   Pulse 82   Temp 97.8 F (36.6 C) (Temporal)   Ht 5' 7"  (1.702 m)   Wt 133 lb (60.3 kg)   SpO2 100%   BMI 20.83 kg/m   Physical Examination:  General: Awake, alert, well nourished, No acute distress HEENT: Normal. Sclera [place], MMM Cardio: regular rate and rhythm, S1S2 heard, no murmurs appreciated Pulm: clear to auscultation bilaterally, no wheezes, rhonchi or rales; normal work of breathing on room air Extremities: warm, well perfused, No edema, cyanosis or clubbing; +2 pulses bilaterally  Assessment/ Plan: 69 y.o. female   1. Herpes simplex vulvovaginitis Stable with as needed use of Valtrex.  I have renewed her medication.  If she has more than 5 flares per year, we will plan for chronic suppressive therapy.  Flu shot administered.  Follow up in 73mfor full physical with fasting labs.  No orders of the defined types were placed in this encounter.  Meds ordered this encounter  Medications  . valACYclovir (VALTREX) 1000 MG tablet    Sig: Take 1 tablet (1,000 mg total) by mouth daily. X 5 days prn    Dispense:  25 tablet    Refill:  0     Cleone Hulick MWindell Moulding DO [place]([phone]

## 2020-06-30 ENCOUNTER — Ambulatory Visit: Payer: Medicare PPO | Admitting: Family Medicine

## 2020-07-06 ENCOUNTER — Encounter: Payer: Self-pay | Admitting: Family Medicine

## 2020-07-06 ENCOUNTER — Encounter: Payer: Self-pay | Admitting: Nurse Practitioner

## 2020-07-06 ENCOUNTER — Ambulatory Visit (INDEPENDENT_AMBULATORY_CARE_PROVIDER_SITE_OTHER): Payer: Medicare PPO | Admitting: Nurse Practitioner

## 2020-07-06 DIAGNOSIS — W57XXXA Bitten or stung by nonvenomous insect and other nonvenomous arthropods, initial encounter: Secondary | ICD-10-CM | POA: Diagnosis not present

## 2020-07-06 DIAGNOSIS — S1096XA Insect bite of unspecified part of neck, initial encounter: Secondary | ICD-10-CM | POA: Diagnosis not present

## 2020-07-06 MED ORDER — MUPIROCIN CALCIUM 2 % EX CREA: 1.0000 "application " | TOPICAL_CREAM | Freq: Two times a day (BID) | CUTANEOUS | 0 refills | Status: DC

## 2020-07-06 NOTE — Assessment & Plan Note (Signed)
Patient is reporting new tick bite of the neck in the last 24 hours.  Patient is reporting that prophylaxis doxycycline did not relief symptoms.  Patient is reporting that new tick bite on the neck is worsening symptoms for old tick bite from November.  Skin around new tick bite is not erythematous, patient denies fever, nausea vomiting, and no signs/symptoms of neurological changes.  Uploaded pictures on patient's chart reviewed.  Mupirocin ordered after review of patient's pictures, advised patient to follow-up with worsening symptoms, needs completing  24-hour doxycycline prophylaxis.

## 2020-07-06 NOTE — Progress Notes (Signed)
Virtual Visit via telephone Note Due to COVID-19 pandemic this visit was conducted virtually. This visit type was conducted due to national recommendations for restrictions regarding the COVID-19 Pandemic (e.g. social distancing, sheltering in place) in an effort to limit this patient's exposure and mitigate transmission in our community. All issues noted in this document were discussed and addressed.  A physical exam was not performed with this format.  I connected with Joanna Perkins. Deman on 07/06/20 at 09:30 am by telephone and verified that I am speaking with the correct person using two identifiers. Joanna Perkins. Joanna Perkins is currently located at home and husband is currently with patient during visit. The provider, Ivy Lynn, NP is located in their office at time of visit.  I discussed the limitations, risks, security and privacy concerns of performing an evaluation and management service by telephone and the availability of in person appointments. I also discussed with the patient that there may be a patient responsible charge related to this service. The patient expressed understanding and agreed to proceed.   History and Present Illness:  Patient is a 70 year old female who is seen over the phone for insect bite.  This is not new for patient.  Patient reports in the last 2 months she had a tick bite which was treated with doxycycline.  Patient reports new tick bite in the last 24 hours.  Patient reports been treated with prophylaxis doxycycline 200 mg once.  Patient believes new tick bite aggravated old tick bite which makes old tick bite red and swollen.  Patient is not reporting any fever, nausea vomiting, no redness on new tick bite site.  No coughing sneezing or shortness of breath.  Pictures of patients  old tick bite uploaded to patients chart by patient. Will review   Review of Systems  Respiratory: Negative for cough.   Cardiovascular: Negative for palpitations.  Skin: Positive for  itching and rash.  All other systems reviewed and are negative.    Observations/Objective: Tele- visit  Assessment and Plan:  Tick bite of neck Patient is reporting new tick bite of the neck in the last 24 hours.  Patient is reporting that prophylaxis doxycycline did not relief symptoms.  Patient is reporting that new tick bite on the neck is worsening symptoms for old tick bite from November.  Skin around new tick bite is not erythematous, patient denies fever, nausea vomiting, and no signs/symptoms of neurological changes.  Uploaded pictures on patient's chart reviewed.  Mupirocin ordered after review of patient's pictures, advised patient to follow-up with worsening symptoms, needs completing  24-hour doxycycline prophylaxis.     Follow Up Instructions:  Follow-up with worsening unresolved symptoms.   I discussed the assessment and treatment plan with the patient. The patient was provided an opportunity to ask questions and all were answered. The patient agreed with the plan and demonstrated an understanding of the instructions.   The patient was advised to call back or seek an in-person evaluation if the symptoms worsen or if the condition fails to improve as anticipated.  The above assessment and management plan was discussed with the patient. The patient verbalized understanding of and has agreed to the management plan. Patient is aware to call the clinic if symptoms persist or worsen. Patient is aware when to return to the clinic for a follow-up visit. Patient educated on when it is appropriate to go to the emergency department.   Time call ended: 9:46 AM  I provided 15 minutes of non-face-to-face  time during this encounter.    Ivy Lynn, NP

## 2020-07-13 ENCOUNTER — Ambulatory Visit: Payer: Medicare PPO | Admitting: Family Medicine

## 2020-07-20 ENCOUNTER — Encounter: Payer: Self-pay | Admitting: Family Medicine

## 2020-07-20 NOTE — Telephone Encounter (Signed)
Ccing this to you.  Patient seems upset and thought it warranted an extra checking into.

## 2020-08-02 ENCOUNTER — Encounter: Payer: Self-pay | Admitting: Family Medicine

## 2020-08-03 ENCOUNTER — Ambulatory Visit (INDEPENDENT_AMBULATORY_CARE_PROVIDER_SITE_OTHER): Payer: Medicare PPO | Admitting: Family Medicine

## 2020-08-03 DIAGNOSIS — S70361A Insect bite (nonvenomous), right thigh, initial encounter: Secondary | ICD-10-CM | POA: Diagnosis not present

## 2020-08-03 DIAGNOSIS — R6889 Other general symptoms and signs: Secondary | ICD-10-CM

## 2020-08-03 DIAGNOSIS — W57XXXA Bitten or stung by nonvenomous insect and other nonvenomous arthropods, initial encounter: Secondary | ICD-10-CM | POA: Diagnosis not present

## 2020-08-03 MED ORDER — DOXYCYCLINE HYCLATE 100 MG PO TABS: 100.0000 mg | ORAL_TABLET | Freq: Two times a day (BID) | ORAL | 2 refills | Status: AC

## 2020-08-03 NOTE — Progress Notes (Signed)
Telephone visit  Subjective: CC: tick bite PCP: [name], DO HTM:BPJPE M. Malbrough is a 70 y.o. female calls for telephone consult today. Patient provides verbal consent for consult held via phone.  Due to COVID-19 pandemic this visit was conducted virtually. This visit type was conducted due to national recommendations for restrictions regarding the COVID-19 Pandemic (e.g. social distancing, sheltering in place) in an effort to limit this patient's exposure and mitigate transmission in our community. All issues noted in this document were discussed and addressed.  A physical exam was not performed with this format.   [place] Location of provider: [facility] Others present for call: none  1.  Tick bite Patient reports that she sustained a tick bite to the right upper thigh on Thursday.  She took a prophylactic dose of doxycycline that she was not sure how long it was on but she knew it was at least 24 hours.  She did get a bull's-eye rash but did not find it to be pruritic or swollen.  She does report flulike symptoms including myalgia, headache, fatigue and chills.  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  She is out of the doxycycline and asking for further instructions   ROS: Per HPI  Allergies  Allergen Reactions  . Gluten Meal    Past Medical History:  Diagnosis Date  . Celiac disease 2008    Current Outpatient Medications:  .  b complex vitamins capsule, Take 1 capsule by mouth daily., Disp: , Rfl:  .  Calcium-Magnesium-Vitamin D (CALCIUM 500 PO), Take 1 capsule by mouth daily., Disp: , Rfl:  .  Iodine, Kelp, (KELP PO), Take 1 capsule by mouth daily., Disp: , Rfl:  .  mupirocin cream (BACTROBAN) 2 %, Apply 1 application topically 2 (two) times daily., Disp: 15 g, Rfl: 0 .  OVER THE COUNTER MEDICATION, 1 capsule daily. Bladderwreck, Disp: , Rfl:  .  traZODone (DESYREL) 50 MG tablet, Take 1.5-2  tablets (75-100 mg total) by mouth at bedtime as needed for sleep. (Patient not taking: Reported on 03/31/2020), Disp: 180 tablet, Rfl: 1 .  TURMERIC PO, Take 1 capsule by mouth daily., Disp: , Rfl:  .  valACYclovir (VALTREX) 1000 MG tablet, Take 1 tablet (1,000 mg total) by mouth daily. X 5 days prn, Disp: 25 tablet, Rfl: 0  Assessment/ Plan: 70 y.o. female   Tick bite of right thigh, initial encounter - Plan: doxycycline (VIBRA-TABS) 100 MG tablet  Flu-like symptoms - Plan: doxycycline (VIBRA-TABS) 100 MG tablet  Has had recurrent issues with tick bites and Lyme flares.  I have given her a 2-week course of doxycycline with a couple of refills extracts should she need them going forward.  We discussed that if symptoms are refractory to the doxycycline or she develops any other worsening symptoms or signs, she is to call the office to be evaluated in office immediately so that we can make sure not missing some other diagnosis.  She was good understanding of the plan and will follow up as needed  Start time: 12:18pm End time: 12:23pm  Total time spent on patient care (including telephone call/ virtual visit): 5 minutes  [place], [place] [phone]

## 2020-08-09 ENCOUNTER — Encounter: Payer: Self-pay | Admitting: Family Medicine

## 2020-08-11 ENCOUNTER — Other Ambulatory Visit: Payer: Self-pay

## 2020-08-11 MED ORDER — DOXYCYCLINE MONOHYDRATE 100 MG PO CAPS: 100.0000 mg | ORAL_CAPSULE | Freq: Two times a day (BID) | ORAL | 2 refills | Status: DC

## 2020-08-13 ENCOUNTER — Encounter: Payer: Self-pay | Admitting: Family Medicine

## 2020-08-14 ENCOUNTER — Other Ambulatory Visit: Payer: Self-pay | Admitting: Family Medicine

## 2020-08-14 DIAGNOSIS — R5383 Other fatigue: Secondary | ICD-10-CM

## 2020-08-19 ENCOUNTER — Encounter: Payer: Self-pay | Admitting: Family Medicine

## 2020-08-19 ENCOUNTER — Other Ambulatory Visit: Payer: Self-pay | Admitting: Family Medicine

## 2020-08-19 DIAGNOSIS — R5383 Other fatigue: Secondary | ICD-10-CM

## 2020-08-19 DIAGNOSIS — M2550 Pain in unspecified joint: Secondary | ICD-10-CM

## 2020-08-20 ENCOUNTER — Other Ambulatory Visit: Payer: Self-pay

## 2020-08-20 ENCOUNTER — Other Ambulatory Visit: Payer: Medicare PPO

## 2020-08-20 DIAGNOSIS — R5383 Other fatigue: Secondary | ICD-10-CM | POA: Diagnosis not present

## 2020-08-21 LAB — CBC WITH DIFFERENTIAL/PLATELET
Basophils Absolute: 0 10*3/uL (ref 0.0–0.2)
Basos: 1 %
EOS (ABSOLUTE): 0 10*3/uL (ref 0.0–0.4)
Eos: 1 %
Hematocrit: 40.2 % (ref 34.0–46.6)
Hemoglobin: 13.2 g/dL (ref 11.1–15.9)
Immature Grans (Abs): 0 10*3/uL (ref 0.0–0.1)
Immature Granulocytes: 0 %
Lymphocytes Absolute: 2.4 10*3/uL (ref 0.7–3.1)
Lymphs: 45 %
MCH: 30.5 pg (ref 26.6–33.0)
MCHC: 32.8 g/dL (ref 31.5–35.7)
MCV: 93 fL (ref 79–97)
Monocytes Absolute: 0.5 10*3/uL (ref 0.1–0.9)
Monocytes: 10 %
Neutrophils Absolute: 2.2 10*3/uL (ref 1.4–7.0)
Neutrophils: 43 %
Platelets: 232 10*3/uL (ref 150–450)
RBC: 4.33 x10E6/uL (ref 3.77–5.28)
RDW: 12.3 % (ref 11.7–15.4)
WBC: 5.2 10*3/uL (ref 3.4–10.8)

## 2020-08-21 LAB — CMP14+EGFR
ALT: 17 IU/L (ref 0–32)
AST: 20 IU/L (ref 0–40)
Albumin/Globulin Ratio: 1.8 (ref 1.2–2.2)
Albumin: 4.2 g/dL (ref 3.8–4.8)
Alkaline Phosphatase: 54 IU/L (ref 44–121)
BUN/Creatinine Ratio: 16 (ref 12–28)
BUN: 16 mg/dL (ref 8–27)
Bilirubin Total: 0.4 mg/dL (ref 0.0–1.2)
CO2: 23 mmol/L (ref 20–29)
Calcium: 9.3 mg/dL (ref 8.7–10.3)
Chloride: 99 mmol/L (ref 96–106)
Creatinine, Ser: 0.98 mg/dL (ref 0.57–1.00)
GFR calc Af Amer: 69 mL/min/{1.73_m2} (ref >59)
GFR calc non Af Amer: 59 mL/min/{1.73_m2} — ABNORMAL LOW (ref >59)
Globulin, Total: 2.4 g/dL (ref 1.5–4.5)
Glucose: 80 mg/dL (ref 65–99)
Potassium: 3.9 mmol/L (ref 3.5–5.2)
Sodium: 138 mmol/L (ref 134–144)
Total Protein: 6.6 g/dL (ref 6.0–8.5)

## 2020-08-21 LAB — THYROID PANEL WITH TSH
Free Thyroxine Index: 2.4 (ref 1.2–4.9)
T3 Uptake Ratio: 41 % — ABNORMAL HIGH (ref 24–39)
T4, Total: 5.9 ug/dL (ref 4.5–12.0)
TSH: 2.99 u[IU]/mL (ref 0.450–4.500)

## 2020-08-21 LAB — VITAMIN B12: Vitamin B-12: 636 pg/mL (ref 232–1245)

## 2020-09-09 NOTE — Progress Notes (Signed)
New Patient Note  RE: Joanna Perkins. Tootle MRN: [id number] DOB: 12-04-1950 Date of Office Visit: 09/10/2020  Referring provider: [name], DO Primary care provider: [name], DO  Chief Complaint: (Foods with sugar)  History of Present Illness: I had the pleasure of seeing Joanna Perkins for initial evaluation at the [facility] on 09/10/2020. She is a 70 y.o. female, who is referred here by [name], DO for the evaluation of food allergy.  Patient has issues with fatigue and not feeling well to the extent of feeling disoriented and running out of energy. Patient states that some of her issues were underlying lyme disease which was treated and thyroid issues which were aggravated by the dental implants she had. The dental implants have been removed and slowly feeling better.  Patient is just concerned whether there are foods that she is eating that may be contributing to her symptoms. Patient avoid foods that has a lot of sugar as it tends to "knock her out" - all forms of sugar in candy, potatoes, alcohol. This also affects her sleep the following day. She sometimes has associated GI symptoms but denies any hives, swelling, wheezing.  Past work up includes: none.  Patient had environmental allergy testing done 8-10 years ago which was all negative.  Dietary History: patient has been eating other foods including goat's milk, eggs,treenuts, shellfish, fish, meats, fruits and vegetables.  Peanuts, soy, wheat cause some GI issues. Patient was diagnosed with celiac's and currently eating gluten free diet.   She reports reading labels and avoiding sugary foods, potatoes, peanuts, soy, wheat in diet completely.   Assessment and Plan: Kirsten is a 70 y.o. female with: Adverse reaction to food, subsequent encounter Patient is concerned whether the foods she is eating could be contributing to some of her symptoms - fatigue, feeling disoriented. She  noticed some food triggers such as alcohol, potatoes and foods high in sugar. She was diagnosed with celiac's in the past. Currently avoiding gluten, peanuts, soy as they cause some GI symptoms.  Discussed with patient length that skin prick testing and bloodwork (food IgE levels) check for IgE mediated reactions which her clinical presentation does not support.   Keep a food journal with symptoms and foods eaten. ? If there's a specific food that seems to trigger this, then we can skin test or get bloodwork to those specific foods.  Continue to avoid foods that are bothersome to you - alcohol, potatoes, candy, gluten.  Consider trying a low FODMAP diet - handout given.  Consider re-evaluation by GI if having continued GI symptoms.  No indication for any food testing at this time.  Return if symptoms worsen or fail to improve.  No orders of the defined types were placed in this encounter.  Lab Orders  No laboratory test(s) ordered today    Other allergy screening: Asthma: no Rhino conjunctivitis: no Medication allergy: no Hymenoptera allergy:  Some shortness of breath, trouble breathing after she got stung by 30 or so ?yellow jackets. Urticaria: no Eczema:no History of recurrent infections suggestive of immunodeficency: no  Diagnostics: None.  Past Medical History: Patient Active Problem List   Diagnosis Date Noted  . Adverse reaction to food, subsequent encounter 09/10/2020  . Tick bite of neck 07/06/2020  . Osteopenia 05/01/2017  . Celiac disease 04/18/2017  . Lyme disease 04/18/2017  . History of squamous cell carcinoma 04/18/2017  . Post-menopausal 04/18/2017   Past Medical History:  Diagnosis Date  .  Celiac disease 2008   Past Surgical History: Past Surgical History:  Procedure Laterality Date  . COLONOSCOPY    . TONSILLECTOMY     Medication List:  Current Outpatient Medications  Medication Sig Dispense Refill  . b complex vitamins capsule Take 1  capsule by mouth daily.    . Calcium-Magnesium-Vitamin D (CALCIUM 500 PO) Take 1 capsule by mouth daily.    Marland Kitchen doxycycline (MONODOX) 100 MG capsule Take 1 capsule (100 mg total) by mouth 2 (two) times daily. 28 capsule 2  . Iodine, Kelp, (KELP PO) Take 1 capsule by mouth daily.    . traZODone (DESYREL) 50 MG tablet Take 1.5-2 tablets (75-100 mg total) by mouth at bedtime as needed for sleep. 180 tablet 1  . TURMERIC PO Take 1 capsule by mouth daily.    . valACYclovir (VALTREX) 1000 MG tablet Take 1 tablet (1,000 mg total) by mouth daily. X 5 days prn 25 tablet 0  . OVER THE COUNTER MEDICATION 1 capsule daily. Bladderwreck     No current facility-administered medications for this visit.   Allergies: Allergies  Allergen Reactions  . Gluten Meal    Social History: Social History   Socioeconomic History  . Marital status: Married    Spouse name: Joanna Perkins  . Number of children: Not on file  . Years of education: Not on file  . Highest education level: Not on file  Occupational History  . Not on file  Tobacco Use  . Smoking status: Former Smoker    Packs/day: 1.00    Years: 15.00    Pack years: 15.00    Types: Cigarettes    Quit date: 1987    Years since quitting: 35.2  . Smokeless tobacco: Never Used  Vaping Use  . Vaping Use: Never used  Substance and Sexual Activity  . Alcohol use: Yes    Alcohol/week: 11.0 standard drinks    Types: 7 Glasses of wine, 4 Cans of beer per week    Comment: daily  . Drug use: No  . Sexual activity: Not on file  Other Topics Concern  . 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 She recently relocated resides with her husband Joanna Perkins.  No biologic children but she does have one stepchild and 2 grandchildren. She prefers holistic approach to her healthcare.   Social Determinants of Health   Financial Resource Strain: Not on file  Food Insecurity: Not on file   Transportation Needs: Not on file  Physical Activity: Not on file  Stress: Not on file  Social Connections: Not on file   Lives in a house. Smoking: quit over 30 years ago Occupation: retired  Programme researcher, broadcasting/film/video History: Environmental education officer in the house: no Charity fundraiser in the family room: no Carpet in the bedroom: no Heating: electric Cooling: mini splits Pet: yes 3 dogs  Family History: Family History  Problem Relation Age of Onset  . Asthma Mother   . Hypertension Mother   . COPD Mother   . Allergic rhinitis Mother   . Hepatitis C Father   . Breast cancer Sister   . Celiac disease Sister   . Thyroid disease Sister   . Celiac disease Brother   . Heart attack Maternal Grandfather   . Heart attack Paternal Grandfather   . Celiac disease Sister   . Thyroid disease Sister   . Colon cancer Neg Hx   . Colon  polyps Neg Hx   . Esophageal cancer Neg Hx   . Rectal cancer Neg Hx   . Eczema Neg Hx   . Urticaria Neg Hx    Review of Systems  Constitutional: Positive for chills and fatigue. Negative for appetite change, fever and unexpected weight change.  HENT: Negative for congestion and rhinorrhea.   Eyes: Negative for itching.  Respiratory: Negative for cough, chest tightness, shortness of breath and wheezing.   Cardiovascular: Negative for chest pain.  Gastrointestinal: Positive for abdominal pain.  Genitourinary: Negative for difficulty urinating.  Skin: Negative for rash.   Objective: BP (!) 120/58 (BP Location: Right Arm, Patient Position: Sitting, Cuff Size: Normal)   Pulse 70   Temp 98.1 F (36.7 C) (Temporal)   Resp 16   Ht 5' 6.5" (1.689 m)   Wt 140 lb 12 oz (63.8 kg)   SpO2 100%   BMI 22.38 kg/m  Body mass index is 22.38 kg/m. Physical Exam Vitals and nursing note reviewed.  Constitutional:      Appearance: Normal appearance. She is well-developed.  HENT:     Head: Normocephalic and atraumatic.     Right Ear: External ear normal.     Left Ear: External ear  normal.     Nose: Nose normal.     Mouth/Throat:     Mouth: Mucous membranes are moist.     Pharynx: Oropharynx is clear.  Eyes:     Conjunctiva/sclera: Conjunctivae normal.  Cardiovascular:     Rate and Rhythm: Normal rate and regular rhythm.     Heart sounds: Normal heart sounds. No murmur heard. No friction rub. No gallop.   Pulmonary:     Effort: Pulmonary effort is normal.     Breath sounds: Normal breath sounds. No wheezing, rhonchi or rales.  Abdominal:     Palpations: Abdomen is soft.  Musculoskeletal:     Cervical back: Neck supple.  Skin:    General: Skin is warm.     Findings: No rash.  Neurological:     Mental Status: She is alert and oriented to person, place, and time.  Psychiatric:        Behavior: Behavior normal.    The plan was reviewed with the patient/family, and all questions/concerned were addressed.  It was my pleasure to see Shaquoya today and participate in her care. Please feel free to contact me with any questions or concerns.  Sincerely,  Rexene Alberts, DO Allergy & Immunology  [facility] office: [place] office: [phone]

## 2020-09-10 ENCOUNTER — Ambulatory Visit: Payer: Medicare PPO | Admitting: Allergy

## 2020-09-10 ENCOUNTER — Other Ambulatory Visit: Payer: Self-pay

## 2020-09-10 ENCOUNTER — Encounter: Payer: Self-pay | Admitting: Allergy

## 2020-09-10 VITALS — BP 120/58 | HR 70 | Temp 98.1°F | Resp 16 | Ht 66.5 in | Wt 140.8 lb

## 2020-09-10 DIAGNOSIS — R5383 Other fatigue: Secondary | ICD-10-CM | POA: Diagnosis not present

## 2020-09-10 DIAGNOSIS — K900 Celiac disease: Secondary | ICD-10-CM

## 2020-09-10 DIAGNOSIS — T781XXD Other adverse food reactions, not elsewhere classified, subsequent encounter: Secondary | ICD-10-CM | POA: Diagnosis not present

## 2020-09-10 NOTE — Patient Instructions (Signed)
   Skin prick testing and bloodwork (food IgE levels) check for IgE mediated reactions which your clinical presentation does not support.   Keep a food journal with symptoms and foods eaten. ? If there's a specific food that seems to trigger this, then we can skin test or get bloodwork to those specific foods.   Continue to avoid foods that are bothersome to you - alcohol, potatoes, candy, gluten.  Consider trying a low FODMAP diet - handout given.  Follow up as needed.

## 2020-09-10 NOTE — Assessment & Plan Note (Addendum)
Patient is concerned whether the foods she is eating could be contributing to some of her symptoms - fatigue, feeling disoriented. She noticed some food triggers such as alcohol, potatoes and foods high in sugar. She was diagnosed with celiac's in the past. Currently avoiding gluten, peanuts, soy as they cause some GI symptoms.  Discussed with patient length that skin prick testing and bloodwork (food IgE levels) check for IgE mediated reactions which her clinical presentation does not support.   Keep a food journal with symptoms and foods eaten. ? If there's a specific food that seems to trigger this, then we can skin test or get bloodwork to those specific foods.  Continue to avoid foods that are bothersome to you - alcohol, potatoes, candy, gluten.  Consider trying a low FODMAP diet - handout given.  Consider re-evaluation by GI if having continued GI symptoms.  No indication for any food testing at this time.

## 2020-11-23 DIAGNOSIS — M5136 Other intervertebral disc degeneration, lumbar region: Secondary | ICD-10-CM | POA: Diagnosis not present

## 2020-11-23 DIAGNOSIS — G8929 Other chronic pain: Secondary | ICD-10-CM | POA: Diagnosis not present

## 2020-11-23 DIAGNOSIS — M5137 Other intervertebral disc degeneration, lumbosacral region: Secondary | ICD-10-CM | POA: Diagnosis not present

## 2020-11-23 DIAGNOSIS — M25552 Pain in left hip: Secondary | ICD-10-CM | POA: Diagnosis not present

## 2020-11-23 DIAGNOSIS — M7582 Other shoulder lesions, left shoulder: Secondary | ICD-10-CM | POA: Diagnosis not present

## 2020-11-23 DIAGNOSIS — M47816 Spondylosis without myelopathy or radiculopathy, lumbar region: Secondary | ICD-10-CM | POA: Diagnosis not present

## 2020-11-24 ENCOUNTER — Encounter: Payer: Self-pay | Admitting: Family Medicine

## 2020-11-25 ENCOUNTER — Other Ambulatory Visit: Payer: Self-pay

## 2020-11-25 ENCOUNTER — Ambulatory Visit (INDEPENDENT_AMBULATORY_CARE_PROVIDER_SITE_OTHER): Payer: Medicare PPO

## 2020-11-25 DIAGNOSIS — M8589 Other specified disorders of bone density and structure, multiple sites: Secondary | ICD-10-CM

## 2020-11-25 DIAGNOSIS — Z780 Asymptomatic menopausal state: Secondary | ICD-10-CM

## 2020-11-25 IMAGING — CR DEXA BONE DENSITY STUDY
1 series · 4 of 4 positions shown · non-contrast
Comparison: None.

CLINICAL DATA: Postmenopausal.

EXAM:
DUAL X-RAY ABSORPTIOMETRY (DXA) FOR BONE MINERAL DENSITY
TECHNIQUE: Bone mineral density measurements are performed of the spine, hip,
and forearm, as appropriate, per International Society of Clinical
Densitometry recommendations. The pertinent regions of interest are
reported below. Non-contributory values are not reported. Images are
obtained for bone mineral density measurement and are not obtained
for diagnostic purposes.

[Series 2: dxa images · 4 of 5 slices shown]
[im 1/5]
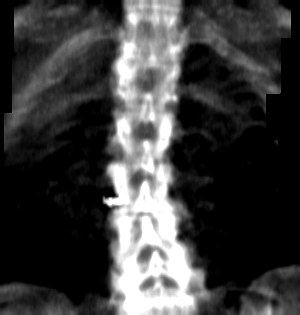
[im 2/5]
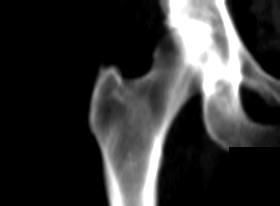
[im 3/5]
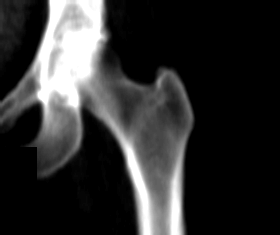
[im 5/5]
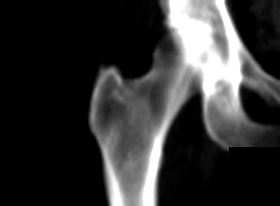

[4 of 4 positions shown; findings below may reference images not displayed]

FINDINGS: AP LUMBAR SPINE L1 and L2

Bone Mineral Density (BMD):  0.997 g/cm2

Young Adult T-Score:  -1.5

Z-Score:

LEFT FEMUR NECK

Bone Mineral Density (BMD):  0.81 g/cm2

Young Adult T-Score: -1.1

Z-Score:

Unit: This study was performed at [HOSPITAL]
on a GE Prodigy Advance.

Scan quality: The scan quality is good. Exclusions: L3 and L4
excluded due to degenerative change.

ASSESSMENT: Patient's diagnostic category is LOW BONE
MASS/OSTEOPENIA by WHO Criteria.

FRACTURE RISK: INCREASED

FRAX: Based on the World Health Organization FRAX model, the 10 year
probability of a major osteoporotic fracture is 7.8%. The 10 year
probability of a hip fracture is 0.7%.
RECOMMENDATIONS

1. All patients should optimize calcium and vitamin D intake.

2. Consider FDA-approved medical therapies in postmenopausal women
and men aged 50 years and older, based on the following:

- A hip or vertebral (clinical or morphometric) fracture

- T-score less than or equal to -2.5 at the femoral neck or spine
after appropriate evaluation to exclude secondary causes

- Low bone mass (T-score between -1.0 and -2.5 at the femoral neck
or spine) and a 10-year probability of a hip fracture greater than
or equal to 3% or a 10-year probability of a major
osteoporosis-related fracture greater than or equal to 20% based on
the [place]-adapted WHO algorithm

- Clinician judgment and/or patient preferences may indicate
treatment for people with 10-year fracture probabilities above or
below these levels

3. Patients with diagnosis of osteoporosis or at high risk for
fracture should have regular bone mineral density tests. For
patients eligible for Medicare, routine testing is allowed once
every 2 years. The testing frequency can be increased to one year
for patients who have rapidly progressing disease, those who are
receiving or discontinuing medical therapy to restore bone mass, or
have additional risk factors.

## 2020-11-27 DIAGNOSIS — Z780 Asymptomatic menopausal state: Secondary | ICD-10-CM | POA: Diagnosis not present

## 2020-11-27 DIAGNOSIS — M8589 Other specified disorders of bone density and structure, multiple sites: Secondary | ICD-10-CM | POA: Diagnosis not present

## 2020-12-10 NOTE — Progress Notes (Signed)
Pt r/c about DEXA Scan results

## 2020-12-31 ENCOUNTER — Encounter: Payer: Self-pay | Admitting: Family Medicine

## 2021-01-01 ENCOUNTER — Other Ambulatory Visit: Payer: Self-pay | Admitting: Family Medicine

## 2021-01-01 MED ORDER — DOXYCYCLINE HYCLATE 100 MG PO TABS
100.0000 mg | ORAL_TABLET | Freq: Two times a day (BID) | ORAL | 1 refills | Status: DC
Start: 2021-01-01 — End: 2021-07-27

## 2021-03-07 ENCOUNTER — Encounter: Payer: Self-pay | Admitting: Family Medicine

## 2021-03-09 NOTE — Telephone Encounter (Signed)
Was seeing an orthopedist for this and was told it was NOT her hip but her back.  Since I have not seen her for this personally, she needs to ask for MRI from her examining provider OR she will need to be seen in office (ok to schedule with another provider if I do not have anything).  Please make sure that we have the records from her orthopedist as well.

## 2021-03-11 ENCOUNTER — Encounter: Payer: Self-pay | Admitting: Family Medicine

## 2021-03-19 ENCOUNTER — Other Ambulatory Visit: Payer: Self-pay | Admitting: Orthopedic Surgery

## 2021-03-19 DIAGNOSIS — M5416 Radiculopathy, lumbar region: Secondary | ICD-10-CM

## 2021-04-02 ENCOUNTER — Other Ambulatory Visit: Payer: Medicare Other

## 2021-04-05 ENCOUNTER — Ambulatory Visit
Admission: RE | Admit: 2021-04-05 | Discharge: 2021-04-05 | Disposition: A | Discharge status: Home / Self Care | Payer: Medicare PPO | Source: Ambulatory Visit | Attending: Orthopedic Surgery | Admitting: Orthopedic Surgery

## 2021-04-05 DIAGNOSIS — M5416 Radiculopathy, lumbar region: Secondary | ICD-10-CM

## 2021-04-05 DIAGNOSIS — M545 Low back pain, unspecified: Secondary | ICD-10-CM | POA: Diagnosis not present

## 2021-04-05 DIAGNOSIS — M48061 Spinal stenosis, lumbar region without neurogenic claudication: Secondary | ICD-10-CM | POA: Diagnosis not present

## 2021-04-05 IMAGING — MR MR LUMBAR SPINE W/O CM
4 of 5 series · 18 of 48 positions shown · non-contrast
Comparison: None.

CLINICAL DATA: Lumbar radiculopathy with left leg pain.

EXAM:
MRI LUMBAR SPINE WITHOUT CONTRAST
TECHNIQUE: Multiplanar, multisequence MR imaging of the lumbar spine was
performed. No intravenous contrast was administered.

[Series 5: T2 · sagittal · 4.0mm · 0.73mm/px · 6 of 15 slices shown (1 of 2)]
[im 1/15]
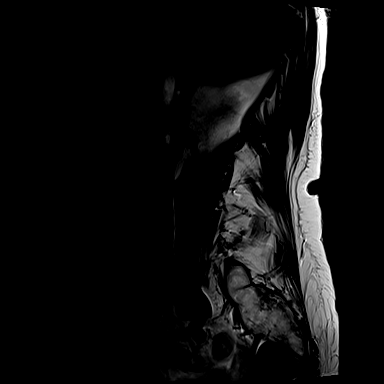
[im 3/15]
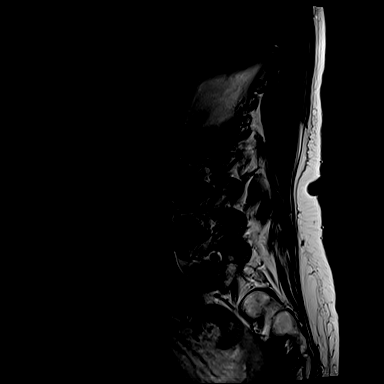
[im 6/15]
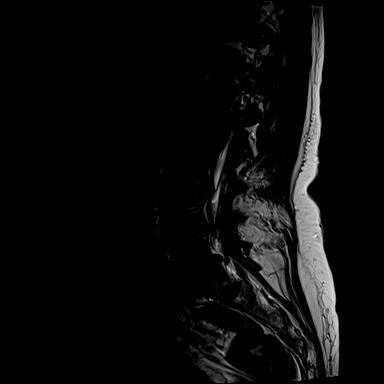
[im 9/15]
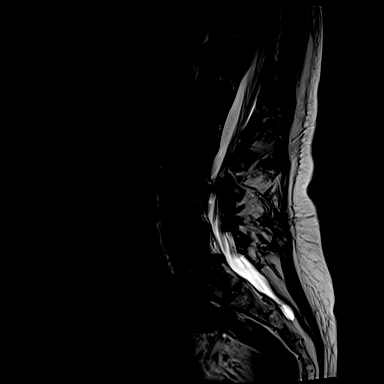
[im 12/15]
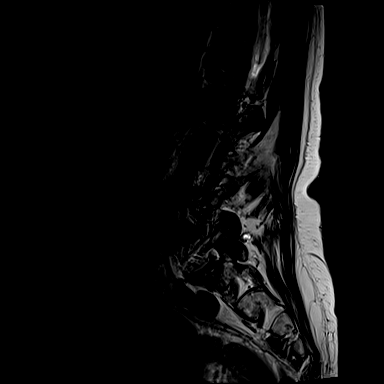
[im 15/15]
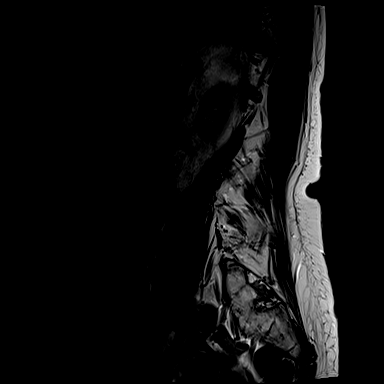

[Series 6: T1 · sagittal · 4.0mm · 0.73mm/px · 3 of 15 slices shown (1 of 2)]
[im 3/15]
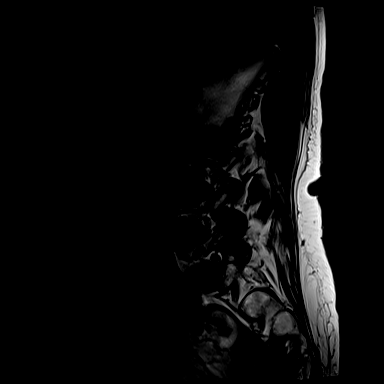
[im 9/15]
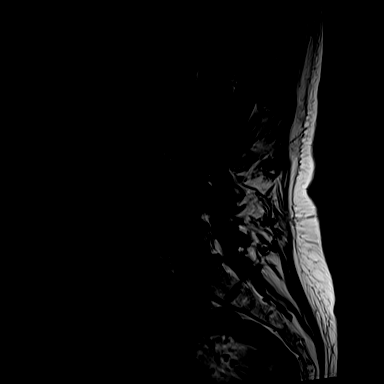
[im 15/15]
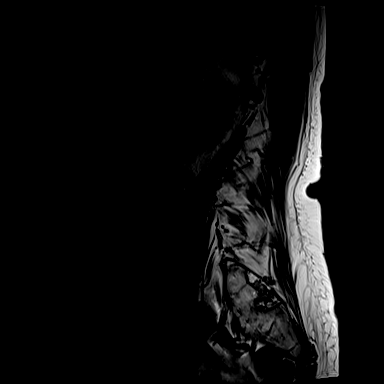

[Series 10: T1 · axial · 4.0mm · 0.28mm/px · z∈[+28,+188]mm · 3 of 39 slices shown (2 of 2)]
[im 6/39]
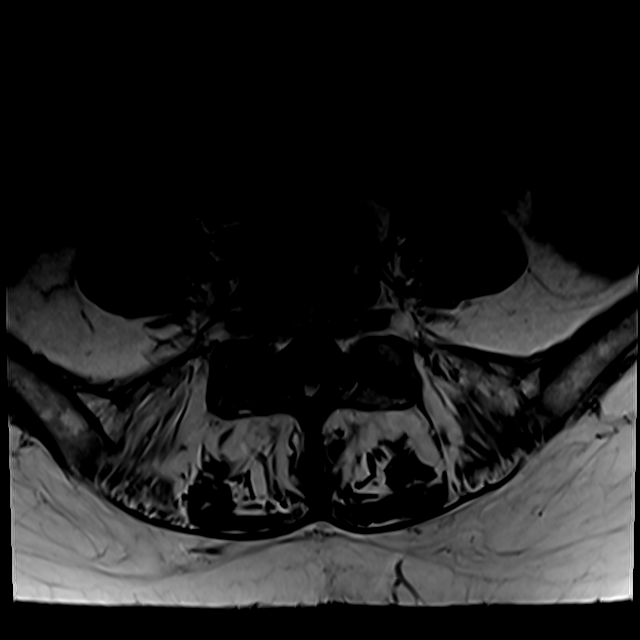
[im 20/39]
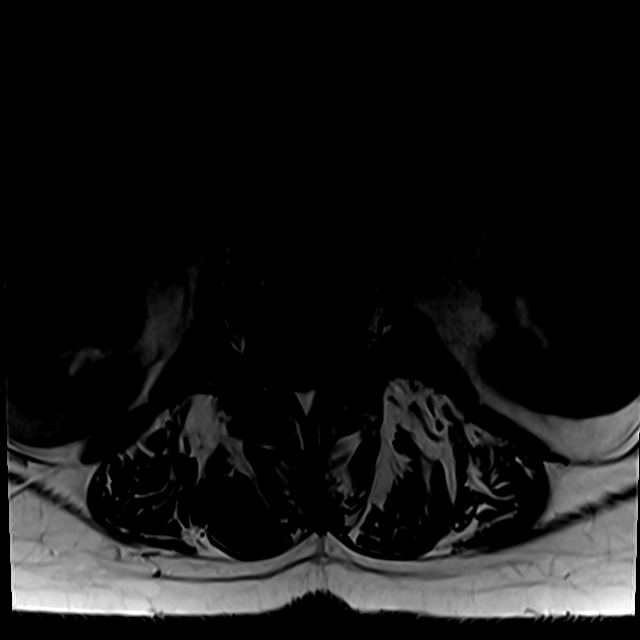
[im 33/39]
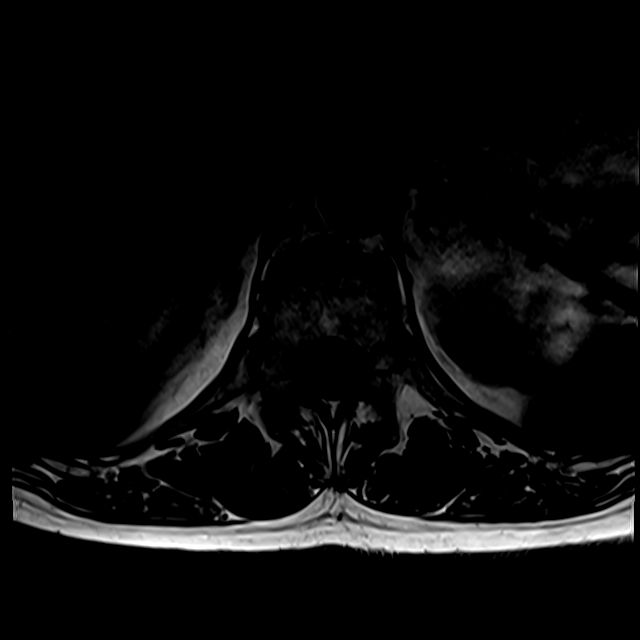

[Series 13: T2 · axial · 4.0mm · 0.28mm/px · z∈[+4,+188]mm · 6 of 39 slices shown (2 of 2)]
[im 1/39]
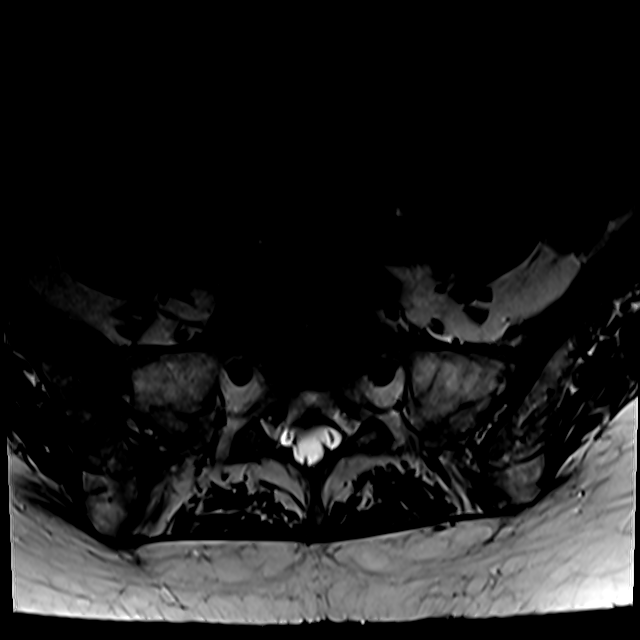
[im 6/39]
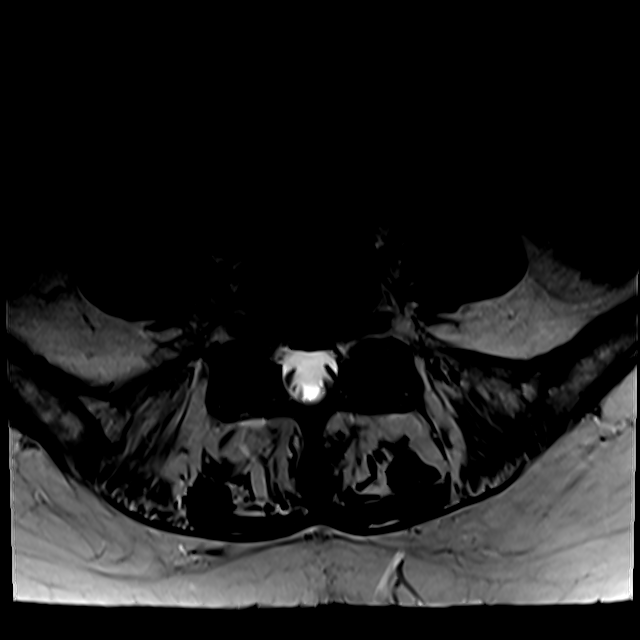
[im 11/39]
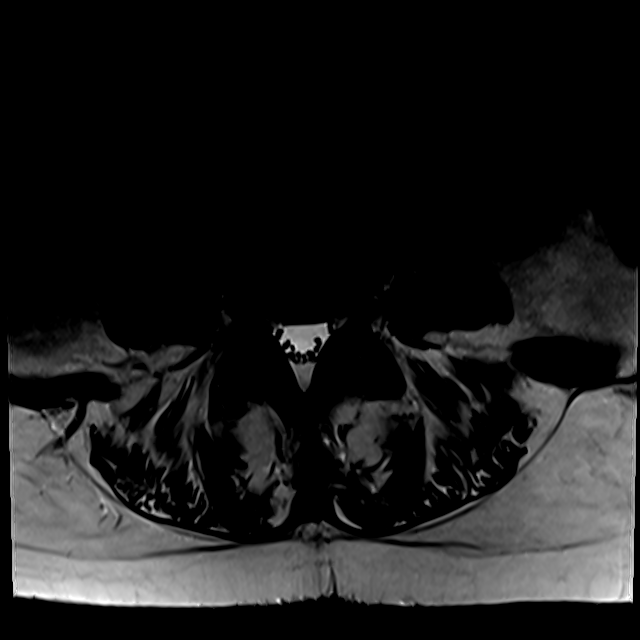
[im 17/39]
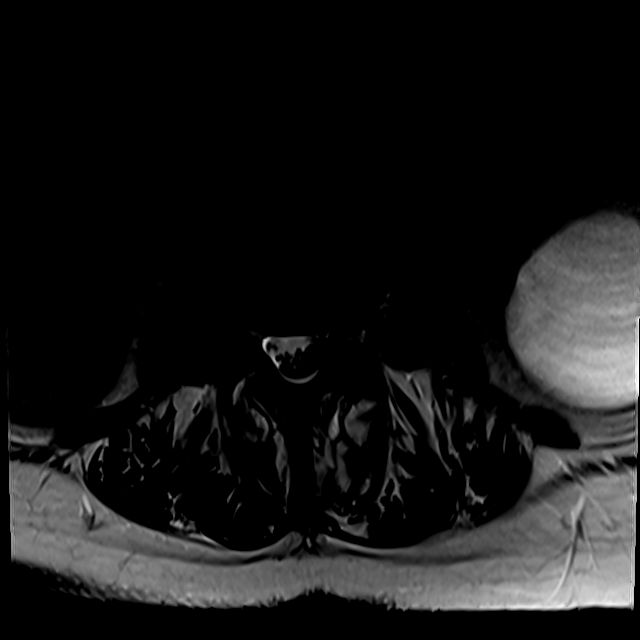
[im 20/39]
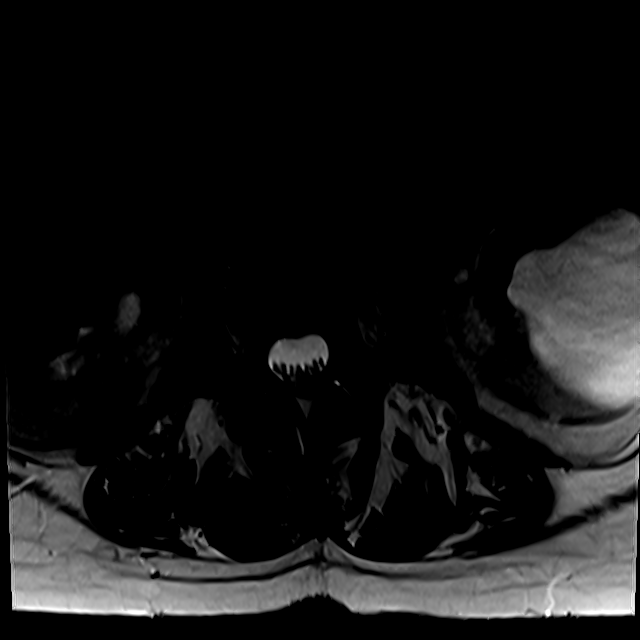
[im 33/39]
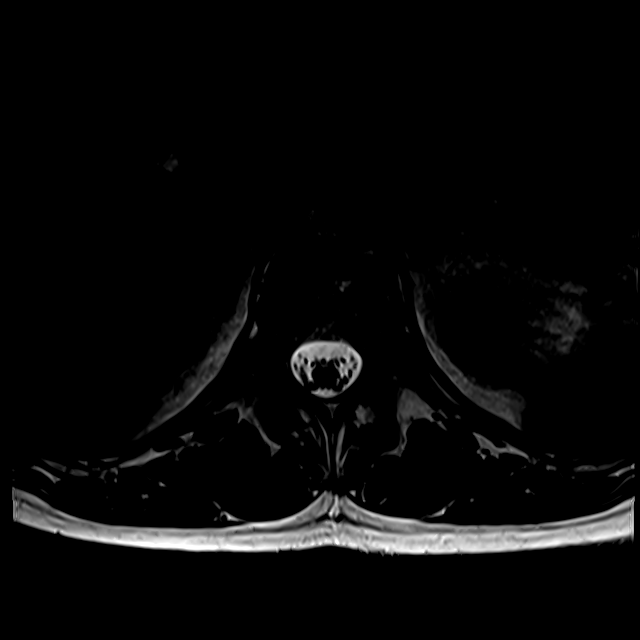

[18 of 48 positions shown; findings below may reference images not displayed]

FINDINGS: Segmentation:  Standard

Alignment:  Normal alignment.  Mild scoliosis.

Vertebrae:  Negative for fracture or mass.

Conus medullaris and cauda equina: Conus extends to the L1-2 level.
Conus and cauda equina appear normal.

Paraspinal and other soft tissues: 6 cm left renal cyst. No
paraspinous mass or adenopathy.

Disc levels:

T12-L1: Mild disc and facet degeneration.  Negative for stenosis

L1-2: Negative

L2-3: Negative

L3-4: Disc degeneration with disc space narrowing and spurring
asymmetric to the left. Mild facet degeneration asymmetric on the
left. Mild to moderate subarticular stenosis on the left. Spinal
canal adequate in size.

L4-5: Disc degeneration with diffuse endplate spurring and bilateral
facet degeneration. Mild subarticular stenosis bilaterally

L5-S1: Mild disc degeneration and spurring. Bilateral facet
degeneration. Mild right foraminal stenosis.
IMPRESSION: Mild to moderate subarticular stenosis on the left at L3-4 due to
disc degeneration and spurring. No significant spinal stenosis

Mild subarticular stenosis bilaterally L4-5

Mild right foraminal stenosis L5-S1 due to spurring.

## 2021-04-13 ENCOUNTER — Other Ambulatory Visit (HOSPITAL_BASED_OUTPATIENT_CLINIC_OR_DEPARTMENT_OTHER): Payer: Self-pay | Admitting: Family Medicine

## 2021-04-13 DIAGNOSIS — Z1231 Encounter for screening mammogram for malignant neoplasm of breast: Secondary | ICD-10-CM

## 2021-04-14 DIAGNOSIS — M47816 Spondylosis without myelopathy or radiculopathy, lumbar region: Secondary | ICD-10-CM | POA: Diagnosis not present

## 2021-04-14 DIAGNOSIS — M5416 Radiculopathy, lumbar region: Secondary | ICD-10-CM | POA: Diagnosis not present

## 2021-04-14 DIAGNOSIS — M5136 Other intervertebral disc degeneration, lumbar region: Secondary | ICD-10-CM | POA: Diagnosis not present

## 2021-04-27 ENCOUNTER — Encounter: Payer: Self-pay | Admitting: Family Medicine

## 2021-04-27 ENCOUNTER — Encounter (HOSPITAL_BASED_OUTPATIENT_CLINIC_OR_DEPARTMENT_OTHER): Payer: Self-pay

## 2021-04-27 ENCOUNTER — Inpatient Hospital Stay (HOSPITAL_BASED_OUTPATIENT_CLINIC_OR_DEPARTMENT_OTHER): Admission: RE | Admit: 2021-04-27 | Payer: Medicare PPO | Source: Ambulatory Visit

## 2021-04-27 DIAGNOSIS — Z1231 Encounter for screening mammogram for malignant neoplasm of breast: Secondary | ICD-10-CM

## 2021-04-27 NOTE — Telephone Encounter (Signed)
Do you know?

## 2021-04-28 ENCOUNTER — Encounter: Payer: Self-pay | Admitting: Family Medicine

## 2021-05-05 DIAGNOSIS — M25559 Pain in unspecified hip: Secondary | ICD-10-CM | POA: Diagnosis not present

## 2021-05-05 DIAGNOSIS — M5116 Intervertebral disc disorders with radiculopathy, lumbar region: Secondary | ICD-10-CM | POA: Diagnosis not present

## 2021-05-05 DIAGNOSIS — R208 Other disturbances of skin sensation: Secondary | ICD-10-CM | POA: Diagnosis not present

## 2021-05-05 DIAGNOSIS — M48061 Spinal stenosis, lumbar region without neurogenic claudication: Secondary | ICD-10-CM | POA: Diagnosis not present

## 2021-05-05 DIAGNOSIS — M2170 Unequal limb length (acquired), unspecified site: Secondary | ICD-10-CM | POA: Diagnosis not present

## 2021-05-05 DIAGNOSIS — M5416 Radiculopathy, lumbar region: Secondary | ICD-10-CM | POA: Diagnosis not present

## 2021-05-05 DIAGNOSIS — M5136 Other intervertebral disc degeneration, lumbar region: Secondary | ICD-10-CM | POA: Diagnosis not present

## 2021-05-11 ENCOUNTER — Encounter: Payer: Self-pay | Admitting: Family Medicine

## 2021-05-11 ENCOUNTER — Telehealth: Payer: Self-pay | Admitting: Family Medicine

## 2021-05-11 NOTE — Telephone Encounter (Signed)
Spoke with patient to schedule Medicare Annual Wellness Visit (AWV) either virtually  Patient declined not interested Please do not call   *due 11/01/2017 awvi per [facility]  please schedule at anytime with health coach  This should be a 45 minute visit.

## 2021-05-18 ENCOUNTER — Encounter: Payer: Self-pay | Admitting: Family Medicine

## 2021-06-30 ENCOUNTER — Ambulatory Visit
Admission: RE | Admit: 2021-06-30 | Discharge: 2021-06-30 | Disposition: A | Discharge status: Home / Self Care | Payer: Medicare PPO | Source: Ambulatory Visit | Attending: Family Medicine | Admitting: Family Medicine

## 2021-06-30 DIAGNOSIS — Z1231 Encounter for screening mammogram for malignant neoplasm of breast: Secondary | ICD-10-CM | POA: Diagnosis not present

## 2021-06-30 IMAGING — MG MM DIGITAL SCREENING BILAT W/ TOMO AND CAD
8 series · 9 of 24 positions shown · non-contrast
Comparison: Previous exam(s).

CLINICAL DATA: Screening.

EXAM:
DIGITAL SCREENING BILATERAL MAMMOGRAM WITH TOMOSYNTHESIS AND CAD
TECHNIQUE: Bilateral screening digital craniocaudal and mediolateral oblique
mammograms were obtained. Bilateral screening digital breast
tomosynthesis was performed. The images were evaluated with
computer-aided detection.

[R MLO synth-2D]
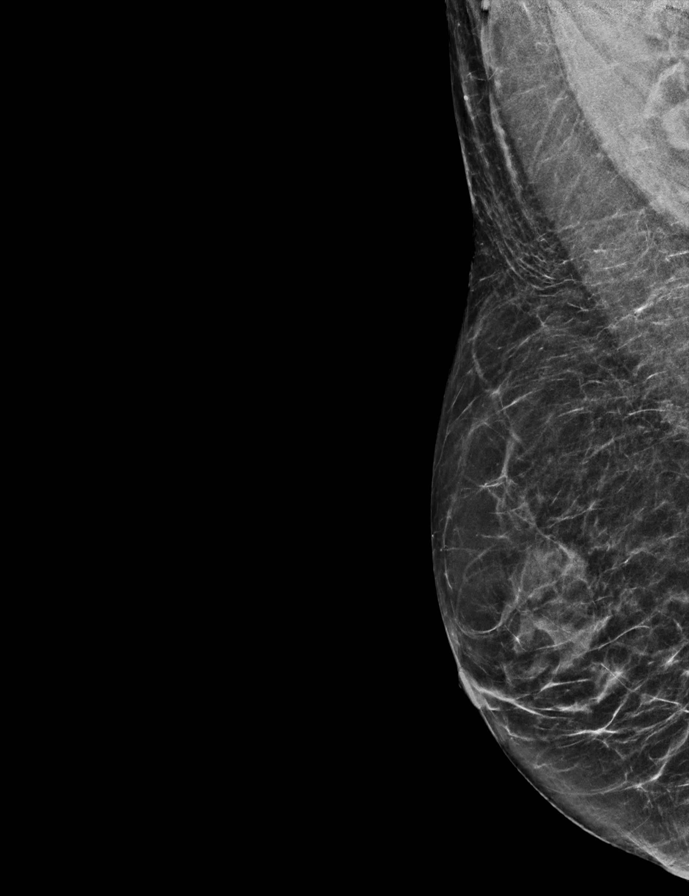

[R CC synth-2D]
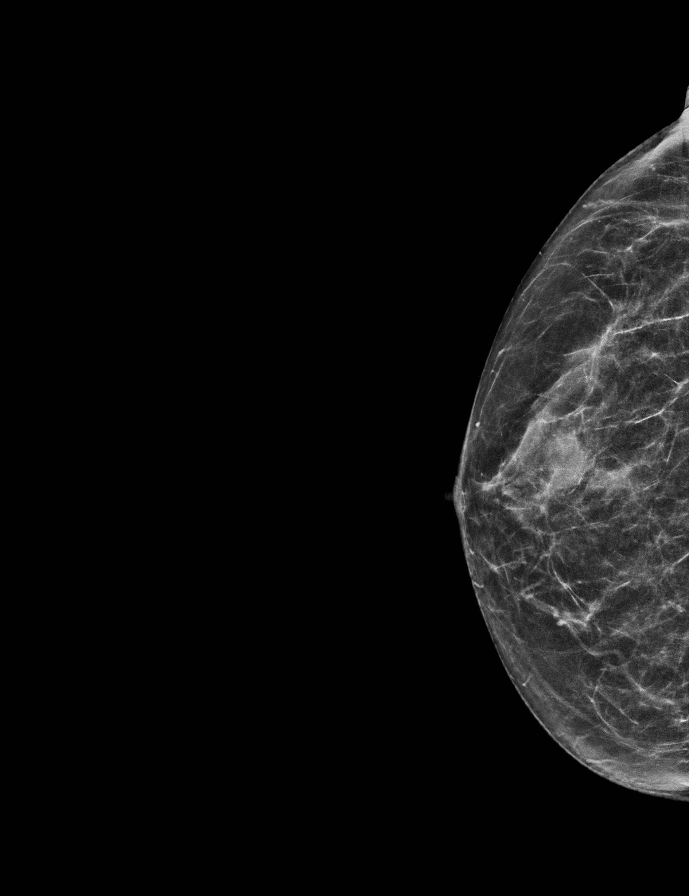

[L MLO synth-2D]
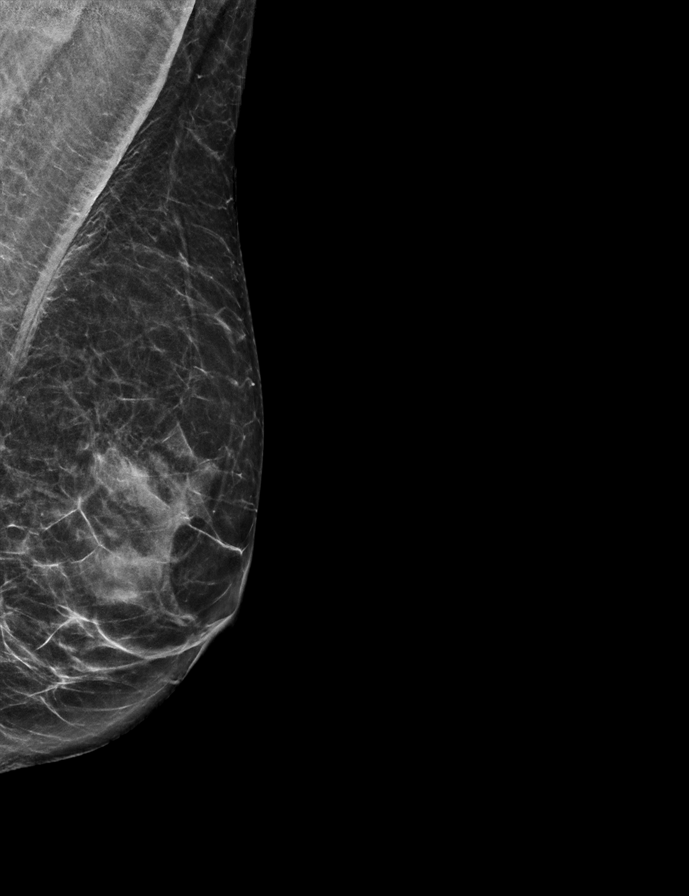

[L CC synth-2D]
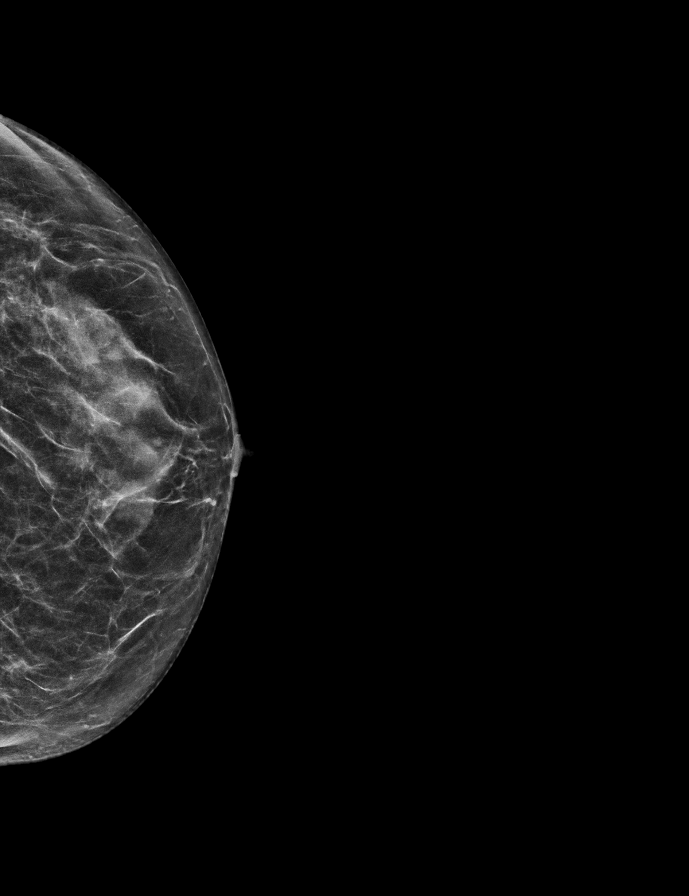

[L MLO tomo · 2 of 56 frames shown]
[frame 19/56]
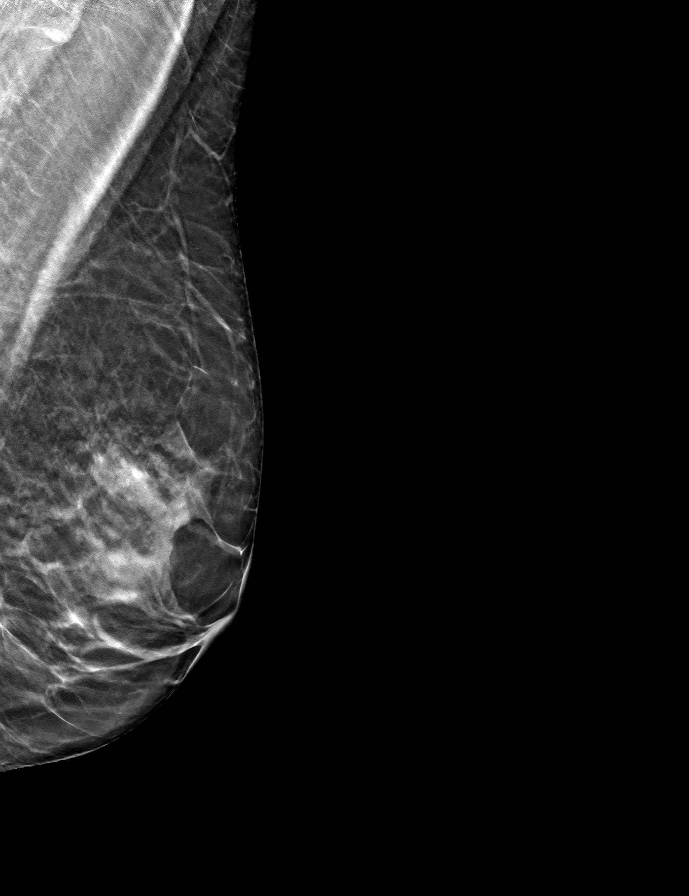
[frame 29/56]
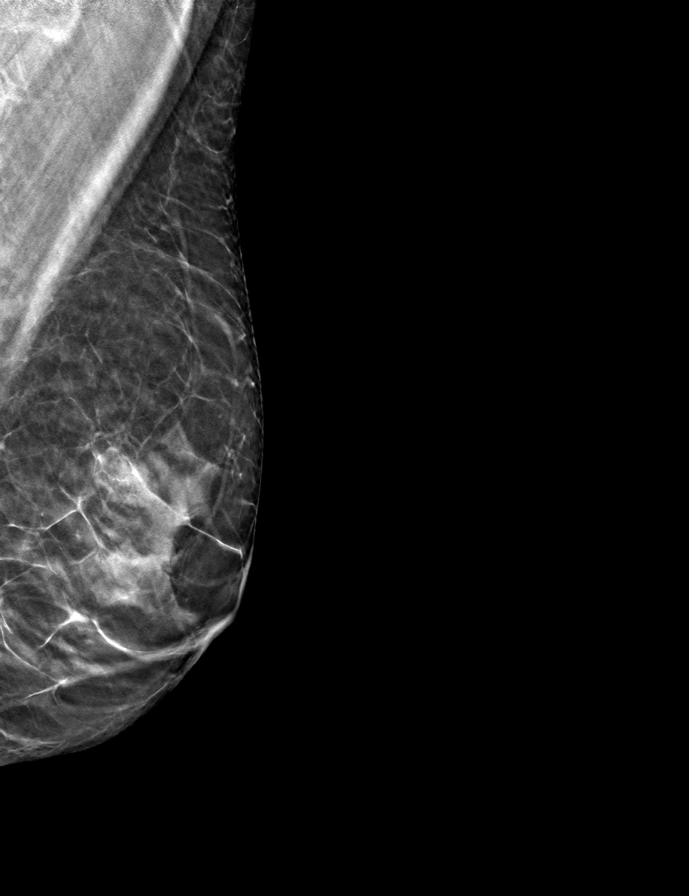

[R MLO tomo · tomo slice 27/53.0]
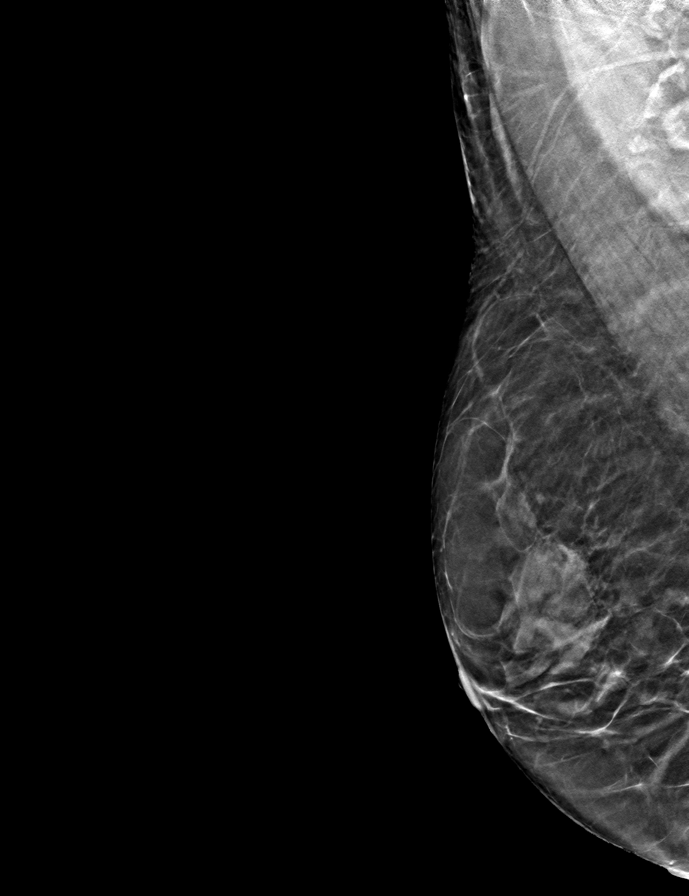

[L CC tomo · tomo slice 29/57.0]
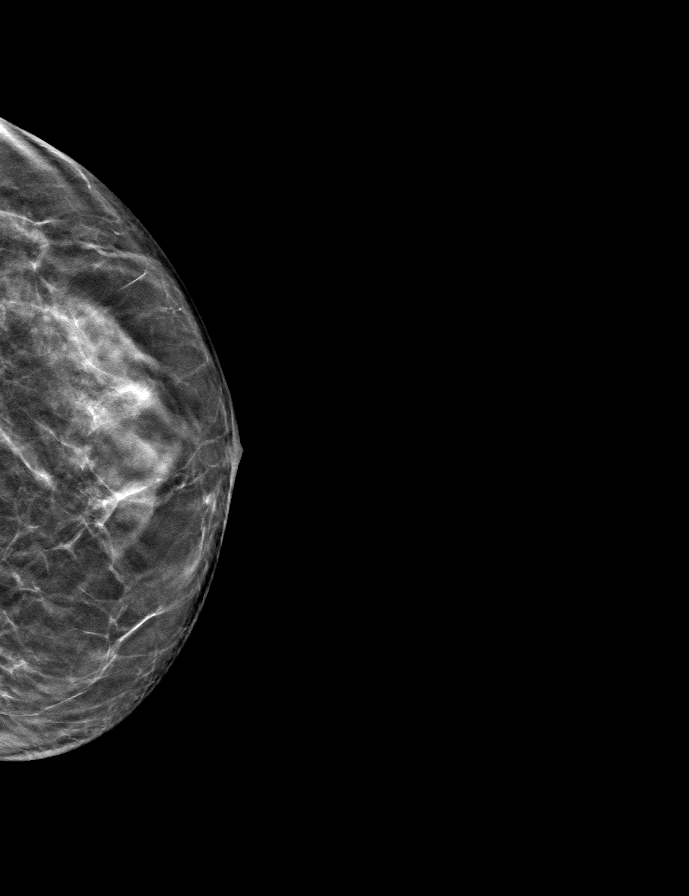

[R CC tomo · tomo slice 27/53.0]
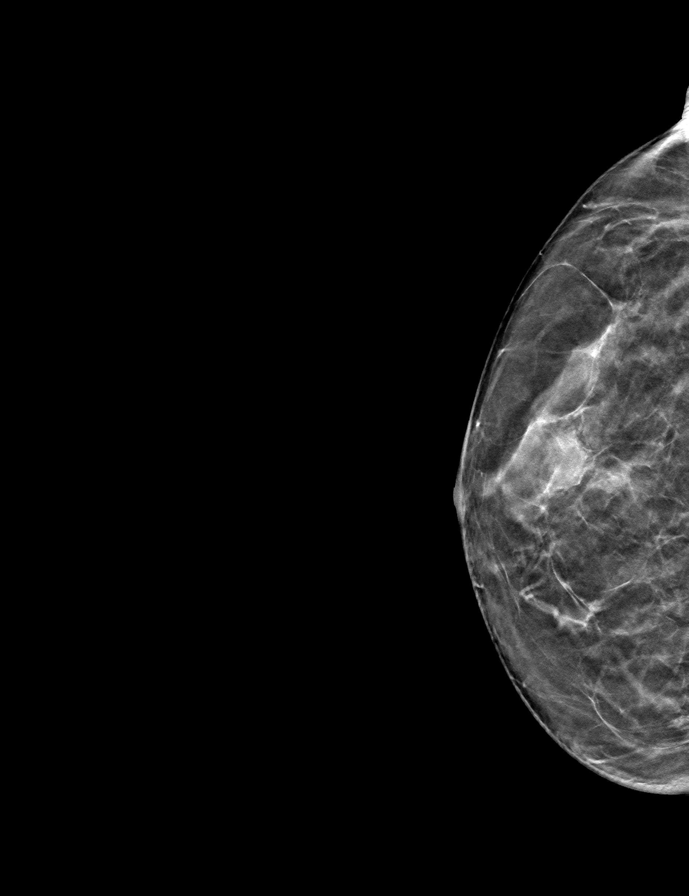

[9 of 24 positions shown; findings below may reference images not displayed]

ACR Breast Density Category c: The breast tissue is heterogeneously
dense, which may obscure small masses.
FINDINGS: There are no findings suspicious for malignancy.
IMPRESSION: No mammographic evidence of malignancy. A result letter of this
screening mammogram will be mailed directly to the patient.

RECOMMENDATION:
Screening mammogram in one year. (Code:[V2])

BI-RADS CATEGORY  1: Negative.

## 2021-07-07 ENCOUNTER — Encounter: Payer: Self-pay | Admitting: Family Medicine

## 2021-07-08 ENCOUNTER — Other Ambulatory Visit: Payer: Self-pay | Admitting: Family Medicine

## 2021-07-08 ENCOUNTER — Telehealth: Payer: Self-pay | Admitting: Family Medicine

## 2021-07-08 ENCOUNTER — Telehealth: Payer: Medicare PPO | Admitting: Family Medicine

## 2021-07-08 DIAGNOSIS — W57XXXA Bitten or stung by nonvenomous insect and other nonvenomous arthropods, initial encounter: Secondary | ICD-10-CM

## 2021-07-08 DIAGNOSIS — S1096XA Insect bite of unspecified part of neck, initial encounter: Secondary | ICD-10-CM

## 2021-07-08 MED ORDER — DOXYCYCLINE HYCLATE 100 MG PO TABS: 100.0000 mg | ORAL_TABLET | Freq: Two times a day (BID) | ORAL | 1 refills | Status: DC

## 2021-07-08 NOTE — Progress Notes (Signed)
[place]   PCP handled prior to appt

## 2021-07-08 NOTE — Telephone Encounter (Signed)
Pt aware.

## 2021-07-12 ENCOUNTER — Other Ambulatory Visit: Payer: Self-pay

## 2021-07-27 ENCOUNTER — Other Ambulatory Visit: Payer: Self-pay | Admitting: Family Medicine

## 2021-07-27 ENCOUNTER — Ambulatory Visit: Payer: Medicare PPO | Admitting: Family Medicine

## 2021-07-27 ENCOUNTER — Encounter: Payer: Self-pay | Admitting: Family Medicine

## 2021-07-27 VITALS — BP 126/76 | HR 78 | Temp 97.7°F | Ht 66.5 in | Wt 141.6 lb

## 2021-07-27 DIAGNOSIS — R413 Other amnesia: Secondary | ICD-10-CM

## 2021-07-27 DIAGNOSIS — R4589 Other symptoms and signs involving emotional state: Secondary | ICD-10-CM | POA: Diagnosis not present

## 2021-07-27 NOTE — Progress Notes (Signed)
Subjective: CC: Depressed mood, difficulty with memory PCP: [name], DO Joanna Perkins is a 71 y.o. female presenting to clinic today for:  1.  Memory difficulty Patient reports ongoing difficulty with short-term memory.  Sometimes she will forget even the day and have to look it up.  She is very dependent upon her calendar to keep herself organized.  She reports emotional lability and big dips in her mood such that she becomes depressed.  She reports a great home life and excellent support by her husband.  There is no specific event that has triggered any of the symptoms that she can identify but she does report having sustained a head injury when she was a child that required stitches of her scalp.  No imaging was obtained at that time.  This occurred somewhere around 1967.    Her energy has been waxing and waning as well.  She wonders if this might be manifestations of chronic Lyme disease.  She has been bitten by multiple ticks and treated multiple occasions with doxycycline.  She is not yet had an MRI of the brain but would be willing to do this.  She has not been treated with antidepressants but would be willing to do this as well if there was no organic disease that would explain her symptoms.  There is a strong family history of thyroid disorder and she would like to make sure that this is checked today.   ROS: Per HPI  Allergies  Allergen Reactions   Gluten Meal    Past Medical History:  Diagnosis Date   Celiac disease 2008    Current Outpatient Medications:    b complex vitamins capsule, Take 1 capsule by mouth daily., Disp: , Rfl:    Calcium-Magnesium-Vitamin D (CALCIUM 500 PO), Take 1 capsule by mouth daily., Disp: , Rfl:    doxycycline (VIBRA-TABS) 100 MG tablet, Take 1 tablet (100 mg total) by mouth 2 (two) times daily. NOV for further fills, Disp: 20 tablet, Rfl: 1   Iodine, Kelp, (KELP PO), Take 1 capsule by mouth daily., Disp: , Rfl:    OVER THE COUNTER  MEDICATION, 1 capsule daily. Bladderwreck, Disp: , Rfl:    traZODone (DESYREL) 50 MG tablet, Take 1.5-2 tablets (75-100 mg total) by mouth at bedtime as needed for sleep., Disp: 180 tablet, Rfl: 1   TURMERIC PO, Take 1 capsule by mouth daily., Disp: , Rfl:    valACYclovir (VALTREX) 1000 MG tablet, Take 1 tablet (1,000 mg total) by mouth daily. X 5 days prn, Disp: 25 tablet, Rfl: 0 Social History   Socioeconomic History   Marital status: Married    Spouse name: [name]   Number of children: Not on file   Years of education: Not on file   Highest education level: Not on file  Occupational History   Not on file  Tobacco Use   Smoking status: Former    Packs/day: 1.00    Years: 15.00    Pack years: 15.00    Types: Cigarettes    Quit date: 1987    Years since quitting: 36.0   Smokeless tobacco: Never  Vaping Use   Vaping Use: Never used  Substance and Sexual Activity   Alcohol use: Yes    Alcohol/week: 11.0 standard drinks    Types: 7 Glasses of wine, 4 Cans of beer per week    Comment: daily   Drug use: No   Sexual activity: Not on file  Other Topics Concern  Not on file  Social History Narrative   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 She recently relocated resides with her husband [name].  No biologic children but she does have one stepchild and 2 grandchildren. She prefers holistic approach to her healthcare.   Social Determinants of Health   Financial Resource Strain: Not on file  Food Insecurity: Not on file  Transportation Needs: Not on file  Physical Activity: Not on file  Stress: Not on file  Social Connections: Not on file  Intimate Partner Violence: Not on file   Family History  Problem Relation Age of Onset   Asthma Mother    Hypertension Mother    COPD Mother    Allergic rhinitis Mother    Hepatitis C Father    Breast cancer Sister    Celiac disease Sister    Thyroid disease Sister    Celiac disease Brother     Heart attack Maternal Grandfather    Heart attack Paternal Grandfather    Celiac disease Sister    Thyroid disease Sister    Colon cancer Neg Hx    Colon polyps Neg Hx    Esophageal cancer Neg Hx    Rectal cancer Neg Hx    Eczema Neg Hx    Urticaria Neg Hx     Objective: Office vital signs reviewed. BP 126/76    Pulse 78    Temp 97.7 F (36.5 C)    Ht 5' 6.5" (1.689 m)    Wt 141 lb 9.6 oz (64.2 kg)    SpO2 100%    BMI 22.51 kg/m   Physical Examination:  General: Awake, alert, well nourished, No acute distress HEENT: No lymphadenopathy.  No exophthalmos or goiter. Cardio: regular rate and rhythm, S1S2 heard, no murmurs appreciated Pulm: clear to auscultation bilaterally, no wheezes, rhonchi or rales; normal work of breathing on room air MSK: normal gait and station Skin: dry; intact; no rashes or lesions Neuro: Follows commands.  No focal neurologic deficits  MMSE - Mini Mental State Exam 07/27/2021  Orientation to time 4  Orientation to Place 5  Registration 3  Attention/ Calculation 5  Recall 2  Language- name 2 objects 2  Language- repeat 1  Language- follow 3 step command 3  Language- read & follow direction 1  Write a sentence 1  Copy design 1  Total score 28     Assessment/ Plan: 71 y.o. female   Memory change - Plan: CMP14+EGFR, Vitamin B12, TSH, T4, Free, CBC, MR Brain Wo Contrast  Depressed mood  Uncertain if the memory changes are related to depressed mood.  Could certainly be manifestations of chronic Lyme but unfortunately there is no great treatment for chronic Lyme at this point.  We discussed briefly consideration for Wellbutrin as treatment for depressed mood.  This may improve focus and energy as well.  I would like to rule out any metabolic or organic etiology first.  Labs have been collected including thyroid, CBC.  We will also order MRI of the brain as we have not obtained this despite her having had issues that seem to be progressive with her  memory over the last couple of years.  Of note her MMSE score did not demonstrate dementia today.  No orders of the defined types were placed in this encounter.  No orders of the defined types were placed in this encounter.    [name], DO [facility] [phone]

## 2021-07-27 NOTE — Patient Instructions (Signed)
We talked about Wellbutrin as a possible treatment option for the symptoms you described I want to rule out any organic processes first though.  I'm going to give [employer].  I think you'd hit it off!

## 2021-07-28 ENCOUNTER — Other Ambulatory Visit: Payer: Self-pay | Admitting: Family Medicine

## 2021-07-28 ENCOUNTER — Encounter: Payer: Self-pay | Admitting: Family Medicine

## 2021-07-28 DIAGNOSIS — R4589 Other symptoms and signs involving emotional state: Secondary | ICD-10-CM

## 2021-07-28 LAB — CMP14+EGFR
ALT: 13 IU/L (ref 0–32)
AST: 19 IU/L (ref 0–40)
Albumin/Globulin Ratio: 1.9 (ref 1.2–2.2)
Albumin: 4.3 g/dL (ref 3.8–4.8)
Alkaline Phosphatase: 54 IU/L (ref 44–121)
BUN/Creatinine Ratio: 14 (ref 12–28)
BUN: 13 mg/dL (ref 8–27)
Bilirubin Total: 0.4 mg/dL (ref 0.0–1.2)
CO2: 26 mmol/L (ref 20–29)
Calcium: 9.4 mg/dL (ref 8.7–10.3)
Chloride: 102 mmol/L (ref 96–106)
Creatinine, Ser: 0.9 mg/dL (ref 0.57–1.00)
Globulin, Total: 2.3 g/dL (ref 1.5–4.5)
Glucose: 93 mg/dL (ref 70–99)
Potassium: 4.2 mmol/L (ref 3.5–5.2)
Sodium: 140 mmol/L (ref 134–144)
Total Protein: 6.6 g/dL (ref 6.0–8.5)
eGFR: 69 mL/min/{1.73_m2} (ref >59)

## 2021-07-28 LAB — CBC
Hematocrit: 39.1 % (ref 34.0–46.6)
Hemoglobin: 13 g/dL (ref 11.1–15.9)
MCH: 29.5 pg (ref 26.6–33.0)
MCHC: 33.2 g/dL (ref 31.5–35.7)
MCV: 89 fL (ref 79–97)
Platelets: 228 10*3/uL (ref 150–450)
RBC: 4.4 x10E6/uL (ref 3.77–5.28)
RDW: 12.8 % (ref 11.7–15.4)
WBC: 6.5 10*3/uL (ref 3.4–10.8)

## 2021-07-28 LAB — T4, FREE: Free T4: 1.4 ng/dL (ref 0.82–1.77)

## 2021-07-28 LAB — VITAMIN B12: Vitamin B-12: 551 pg/mL (ref 232–1245)

## 2021-07-28 LAB — TSH: TSH: 1.19 u[IU]/mL (ref 0.450–4.500)

## 2021-07-28 MED ORDER — BUPROPION HCL ER (SR) 150 MG PO TB12: 150.0000 mg | ORAL_TABLET | Freq: Every day | ORAL | 0 refills | Status: DC

## 2021-07-29 ENCOUNTER — Encounter: Payer: Self-pay | Admitting: Family Medicine

## 2021-07-30 ENCOUNTER — Other Ambulatory Visit: Payer: Self-pay | Admitting: Family Medicine

## 2021-07-30 DIAGNOSIS — R4589 Other symptoms and signs involving emotional state: Secondary | ICD-10-CM

## 2021-07-30 DIAGNOSIS — F419 Anxiety disorder, unspecified: Secondary | ICD-10-CM

## 2021-07-30 MED ORDER — SERTRALINE HCL 50 MG PO TABS: 50.0000 mg | ORAL_TABLET | Freq: Every day | ORAL | 5 refills | Status: DC

## 2021-07-31 ENCOUNTER — Encounter: Payer: Self-pay | Admitting: Family Medicine

## 2021-08-02 ENCOUNTER — Other Ambulatory Visit: Payer: Self-pay | Admitting: Family Medicine

## 2021-08-02 ENCOUNTER — Encounter (HOSPITAL_BASED_OUTPATIENT_CLINIC_OR_DEPARTMENT_OTHER): Payer: Self-pay | Admitting: Orthopaedic Surgery

## 2021-08-02 DIAGNOSIS — M25562 Pain in left knee: Secondary | ICD-10-CM

## 2021-08-03 ENCOUNTER — Other Ambulatory Visit (HOSPITAL_BASED_OUTPATIENT_CLINIC_OR_DEPARTMENT_OTHER): Payer: Self-pay | Admitting: Orthopaedic Surgery

## 2021-08-03 DIAGNOSIS — M25561 Pain in right knee: Secondary | ICD-10-CM

## 2021-08-04 ENCOUNTER — Ambulatory Visit (HOSPITAL_BASED_OUTPATIENT_CLINIC_OR_DEPARTMENT_OTHER)
Admission: RE | Admit: 2021-08-04 | Discharge: 2021-08-04 | Disposition: A | Discharge status: Home / Self Care | Payer: Medicare PPO | Source: Ambulatory Visit | Attending: Orthopaedic Surgery | Admitting: Orthopaedic Surgery

## 2021-08-04 ENCOUNTER — Other Ambulatory Visit: Payer: Self-pay

## 2021-08-04 ENCOUNTER — Ambulatory Visit (INDEPENDENT_AMBULATORY_CARE_PROVIDER_SITE_OTHER): Payer: Medicare PPO | Admitting: Orthopaedic Surgery

## 2021-08-04 ENCOUNTER — Encounter (HOSPITAL_BASED_OUTPATIENT_CLINIC_OR_DEPARTMENT_OTHER): Payer: Self-pay | Admitting: Orthopaedic Surgery

## 2021-08-04 DIAGNOSIS — M1711 Unilateral primary osteoarthritis, right knee: Secondary | ICD-10-CM | POA: Diagnosis not present

## 2021-08-04 DIAGNOSIS — M25561 Pain in right knee: Secondary | ICD-10-CM | POA: Diagnosis not present

## 2021-08-04 DIAGNOSIS — M2391 Unspecified internal derangement of right knee: Secondary | ICD-10-CM | POA: Diagnosis not present

## 2021-08-04 IMAGING — DX DG KNEE COMPLETE 4+V*R*
6 series · 6 of 6 positions shown · non-contrast
Comparison: None.

CLINICAL DATA: Right lateral knee pain.

EXAM:
RIGHT KNEE - COMPLETE 4+ VIEW

[knee ap]
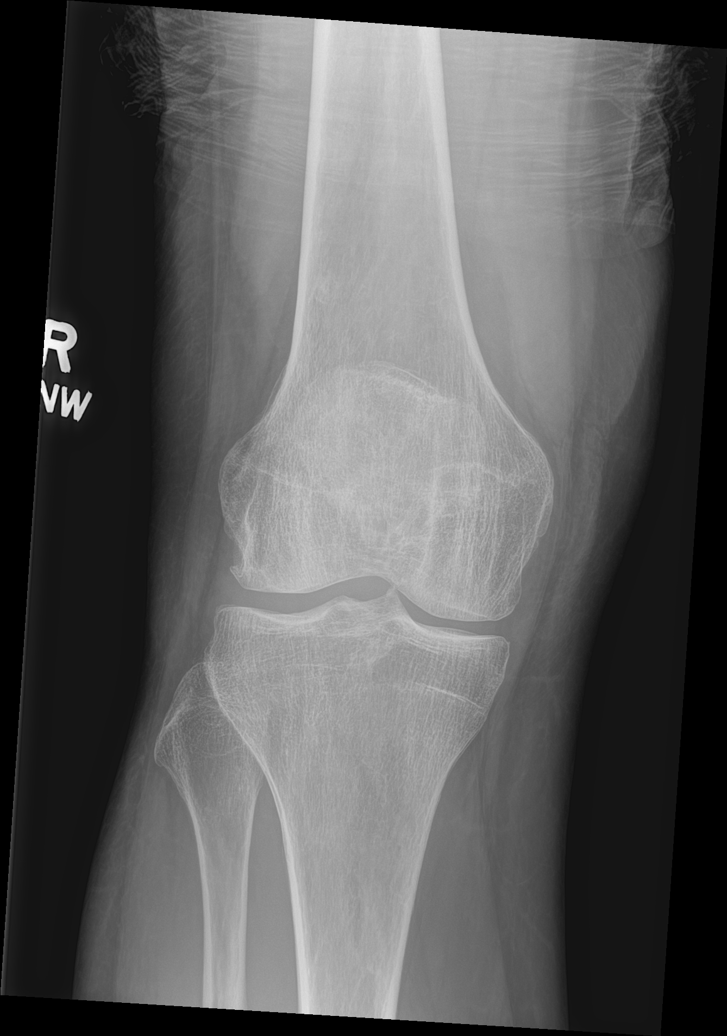

[knee obl (1 of 2)]
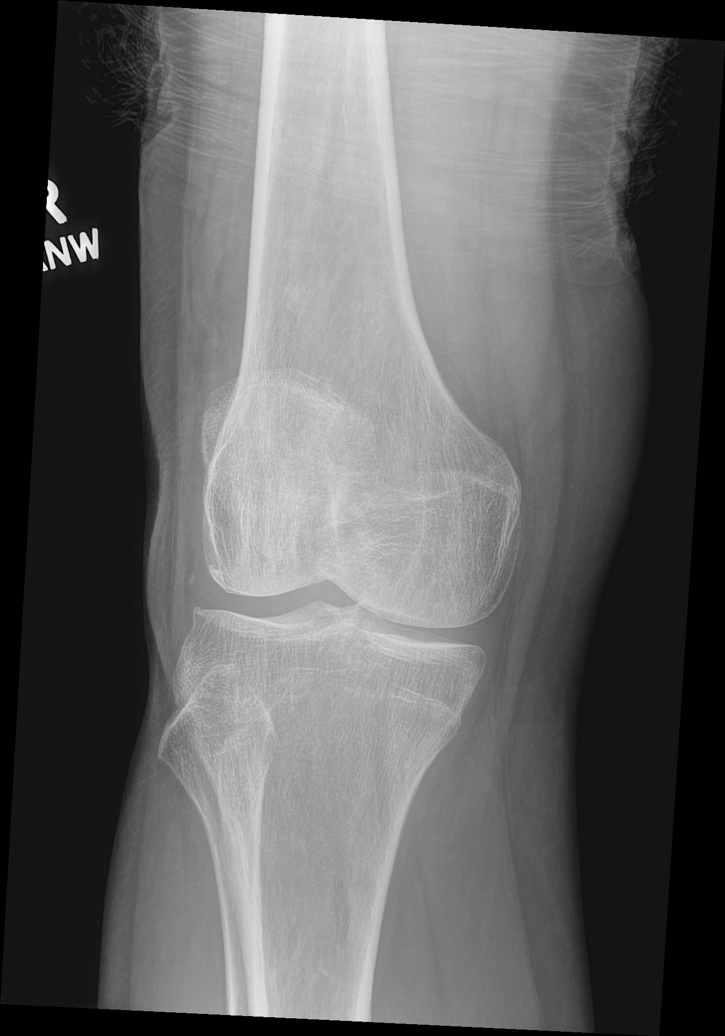

[knee obl (2 of 2)]
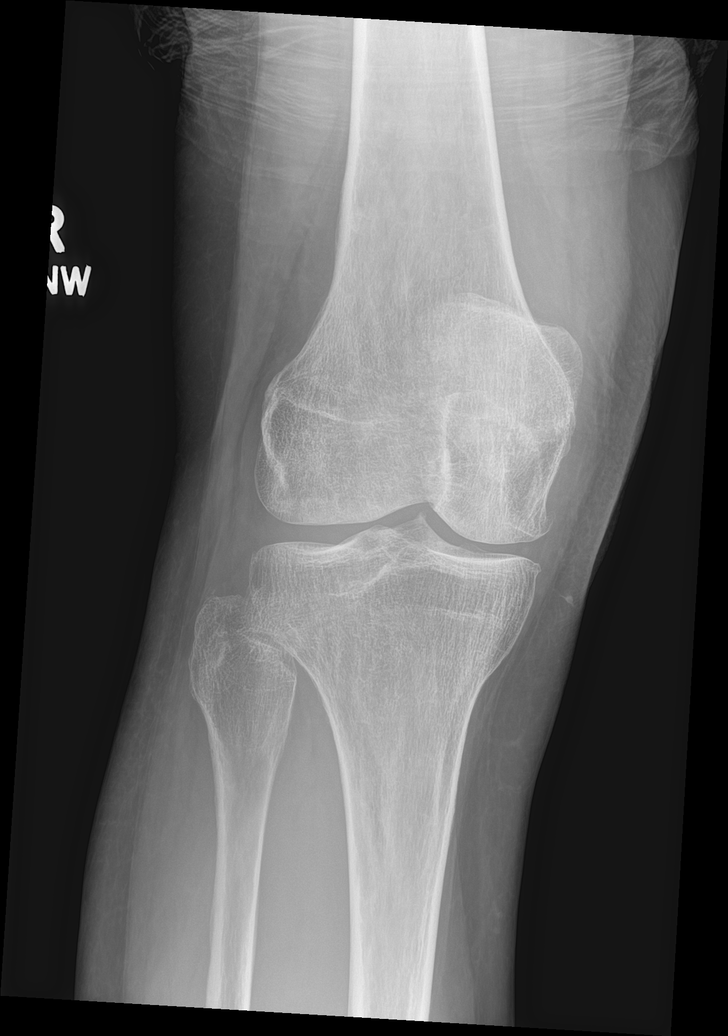

[knee lat]
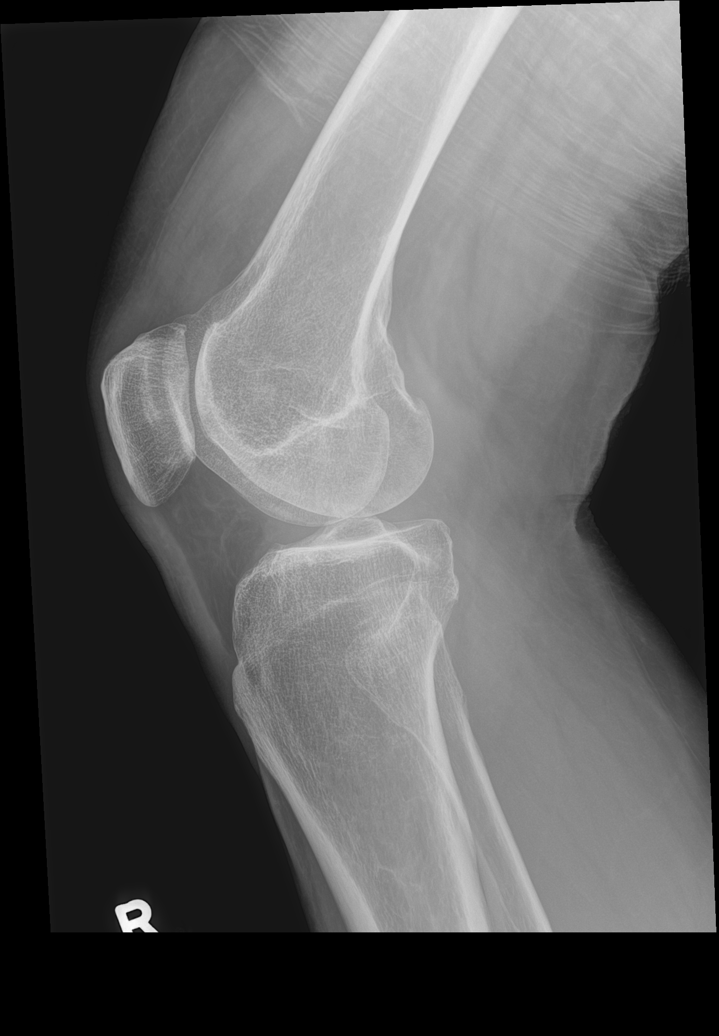

[patella skyline]
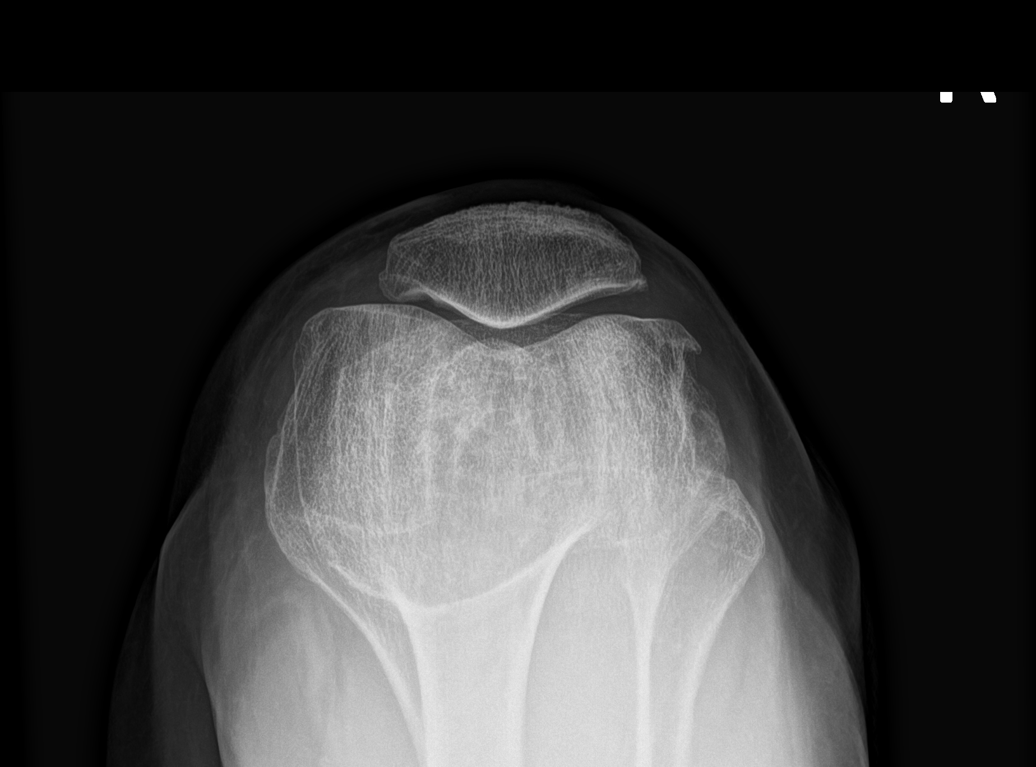

[knee tunnel]
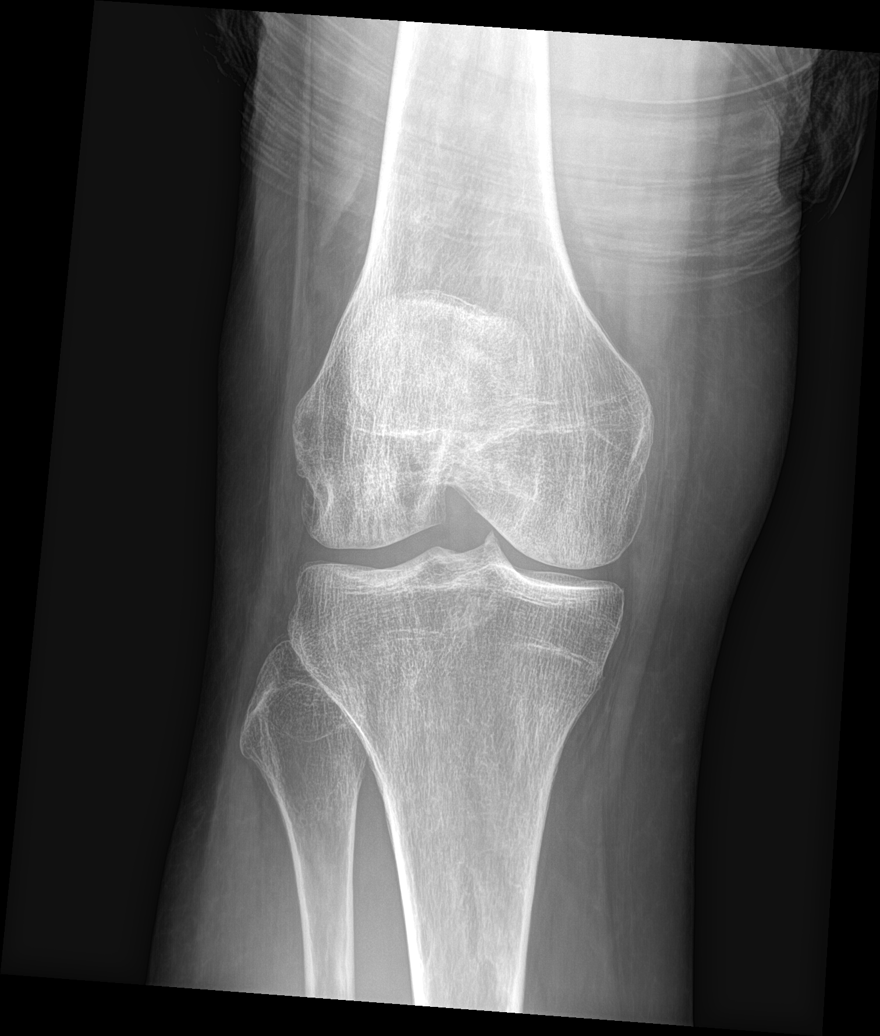

[6 of 6 positions shown; findings below may reference images not displayed]

FINDINGS: Mild medial compartment joint space narrowing. Minimal peripheral
medial compartment degenerative osteophytes on oblique view. Minimal
peripheral lateral compartment degenerative osteophytes.

No joint effusion.

Minimal superior patellar degenerative osteophytosis.
Mild-to-moderate medial and mild lateral patellar and moderate
lateral trochlear peripheral degenerative osteophytes on sunrise
view. Mild patellofemoral joint space narrowing.

No acute fracture or dislocation.
IMPRESSION: Mild medial compartment and patellofemoral compartment
osteoarthritis.

## 2021-08-04 NOTE — Progress Notes (Signed)
Chief Complaint: right knee pain     History of Present Illness:    Joanna Perkins. Utley is a 71 y.o. female presents with multiple months of right knee pain although worse over the course of the last several weeks.  She did hit this on a bed which caused direct pain.  She also had a jerking type injury where her dog dragged her which caused significant pain in the knee.  She enjoys being very active and is currently retired on a farm with multiple acres.  She has 3 [name]'s dogs which are very big that she enjoys walking.  In terms of treatments she has tried ice, activity restriction, over-the-counter Voltaren, anti-inflammatories.  This only provides transient relief.  She has been using an over-the-counter compression sleeve.  Denies any previous surgery.    Surgical History:   None  PMH/PSH/Family History/Social History/Meds/Allergies:    Past Medical History:  Diagnosis Date   Celiac disease 2008   Past Surgical History:  Procedure Laterality Date   COLONOSCOPY     TONSILLECTOMY     Social History   Socioeconomic History   Marital status: Married    Spouse name: [name]   Number of children: Not on file   Years of education: Not on file   Highest education level: Not on file  Occupational History   Not on file  Tobacco Use   Smoking status: Former    Packs/day: 1.00    Years: 15.00    Pack years: 15.00    Types: Cigarettes    Quit date: 1987    Years since quitting: 36.1   Smokeless tobacco: Never  Vaping Use   Vaping Use: Never used  Substance and Sexual Activity   Alcohol use: Yes    Alcohol/week: 11.0 standard drinks    Types: 7 Glasses of wine, 4 Cans of beer per week    Comment: daily   Drug use: No   Sexual activity: Not on file  Other Topics Concern   Not on file  Social History Narrative   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 She recently relocated resides with her  husband [name].  No biologic children but she does have one stepchild and 2 grandchildren. She prefers holistic approach to her healthcare.   Social Determinants of Health   Financial Resource Strain: Not on file  Food Insecurity: Not on file  Transportation Needs: Not on file  Physical Activity: Not on file  Stress: Not on file  Social Connections: Not on file   Family History  Problem Relation Age of Onset   Asthma Mother    Hypertension Mother    COPD Mother    Allergic rhinitis Mother    Hepatitis C Father    Breast cancer Sister    Celiac disease Sister    Thyroid disease Sister    Celiac disease Brother    Heart attack Maternal Grandfather    Heart attack Paternal Grandfather    Celiac disease Sister    Thyroid disease Sister    Colon cancer Neg Hx    Colon polyps Neg Hx    Esophageal cancer Neg Hx    Rectal cancer Neg Hx    Eczema Neg Hx    Urticaria Neg Hx  Allergies  Allergen Reactions   Gluten Meal    Current Outpatient Medications  Medication Sig Dispense Refill   b complex vitamins capsule Take 1 capsule by mouth daily.     Calcium-Magnesium-Vitamin D (CALCIUM 500 PO) Take 1 capsule by mouth daily.     doxycycline (VIBRA-TABS) 100 MG tablet TAKE ONE TABLET TWICE DAILY FOR 7 DAYS 20 tablet 1   Iodine, Kelp, (KELP PO) Take 1 capsule by mouth daily.     sertraline (ZOLOFT) 50 MG tablet Take 1 tablet (50 mg total) by mouth daily. 30 tablet 5   TURMERIC PO Take 1 capsule by mouth daily.     No current facility-administered medications for this visit.   No results found.  Review of Systems:   A ROS was performed including pertinent positives and negatives as documented in the HPI.  Physical Exam :   Constitutional: NAD and appears stated age Neurological: Alert and oriented Psych: Appropriate affect and cooperative There were no vitals taken for this visit.   Comprehensive Musculoskeletal Exam:      Musculoskeletal Exam  Gait Normal  Alignment  Normal   Right Left  Inspection Normal Normal  Palpation    Tenderness Lateral joint None  Crepitus None None  Effusion Trace None  Range of Motion    Extension 0 0  Flexion 135 135  Strength    Extension 5/5 5/5  Flexion 5/5 5/5  Ligament Exam     Generalized Laxity No No  Lachman Negative Negative   Pivot Shift Negative Negative  Anterior Drawer Negative Negative  Valgus at 0 Negative Negative  Valgus at 20 Negative Negative  Varus at 0 0 0  Varus at 20   0 0  Posterior Drawer at 90 0 0  Vascular/Lymphatic Exam    Edema None None  Venous Stasis Changes No No  Distal Circulation Normal Normal  Neurologic    Light Touch Sensation Intact Intact  Special Tests: Positive McMurray laterally     Imaging:   Xray (5 views right knee): Very mild lateral compartment osteoarthritis which is minimal otherwise healthy joint spaces   I personally reviewed and interpreted the radiographs.   Assessment:   71 year old female with right lateral base joint pain over the knee.  I did discuss that given the fact she has failed multiple conservative managements and she is very active including yoga with a home exercise program on the knee, I do believe that an MRI of the knee will be helpful to further assess the issue and the underlying root cause.  Specifically I would want to rule out any type of meniscal injury  Plan :    -Plan for right knee MRI and follow-up to discuss results   I believe that advance imaging in the form of an MRI is indicated for the following reasons: -Xrays images were obtained and not diagnostic -The patient has failed treatment modalities including rest, ice, Voltaren gel, NSAIDs, ice, compression sleeve, home exercise program, yoga -The following worrisome symptoms are present on history and exam: Lateral joint line tenderness with positive McMurray       I personally saw and evaluated the patient, and participated in the management and treatment  plan.  [name], MD Attending Physician, Orthopedic Surgery  This document was dictated using Dragon voice recognition software. A reasonable attempt at proof reading has been made to minimize errors.

## 2021-08-10 ENCOUNTER — Other Ambulatory Visit: Payer: Self-pay | Admitting: Family Medicine

## 2021-08-10 ENCOUNTER — Other Ambulatory Visit: Payer: Self-pay

## 2021-08-10 ENCOUNTER — Ambulatory Visit (HOSPITAL_COMMUNITY)
Admission: RE | Admit: 2021-08-10 | Discharge: 2021-08-10 | Disposition: A | Discharge status: Home / Self Care | Payer: Medicare PPO | Source: Ambulatory Visit | Attending: Family Medicine | Admitting: Family Medicine

## 2021-08-10 DIAGNOSIS — R413 Other amnesia: Secondary | ICD-10-CM | POA: Insufficient documentation

## 2021-08-10 DIAGNOSIS — E348 Other specified endocrine disorders: Secondary | ICD-10-CM

## 2021-08-10 DIAGNOSIS — M25561 Pain in right knee: Secondary | ICD-10-CM | POA: Insufficient documentation

## 2021-08-10 IMAGING — MR MR HEAD W/O CM
11 of 12 series · 35 of 48 positions shown · non-contrast
Comparison: No pertinent prior exams available for comparison.

CLINICAL DATA: Memory change.  Memory loss.

EXAM:
MRI HEAD WITHOUT CONTRAST
TECHNIQUE: Multiplanar, multiecho pulse sequences of the brain and surrounding
structures were obtained without intravenous contrast.

[Series 9: DWI · axial · 3.0mm · 0.77mm/px · z∈[-35,+100]mm · 3 of 52 slices shown (1 of 4)]
[im 1/52]
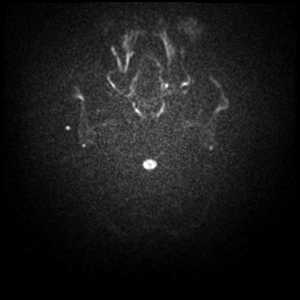
[im 26/52]
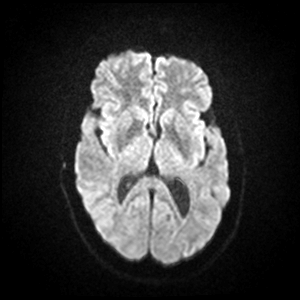
[im 52/52]
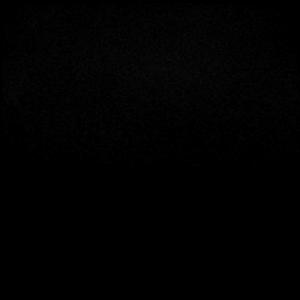

[Series 10: DWI · axial · 3.0mm · 0.77mm/px · z∈[-35,+92]mm · 3 of 47 slices shown (2 of 4)]
[im 1/47]
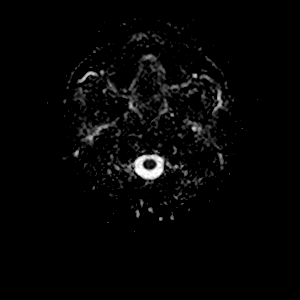
[im 24/47]
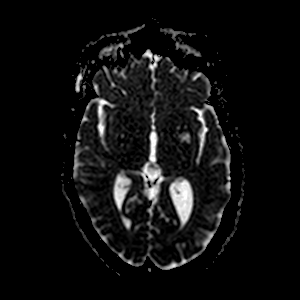
[im 47/47]
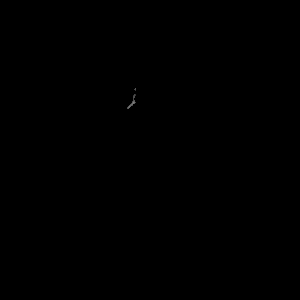

[Series 11: DWI · coronal · 5.0mm · 0.88mm/px · 3 of 34 slices shown (3 of 4)]
[im 1/34]
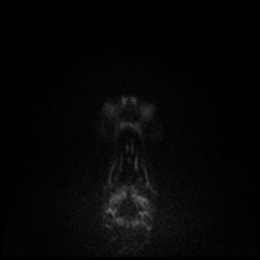
[im 17/34]
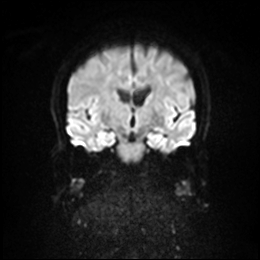
[im 34/34]
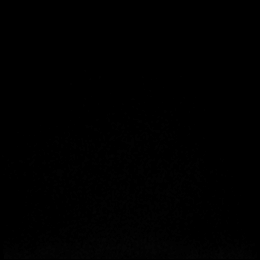

[Series 12: DWI · coronal · 5.0mm · 0.88mm/px · 3 of 33 slices shown (4 of 4)]
[im 1/33]
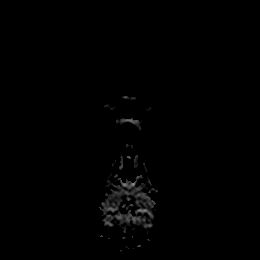
[im 17/33]
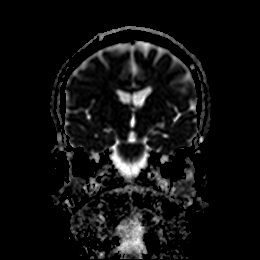
[im 33/33]
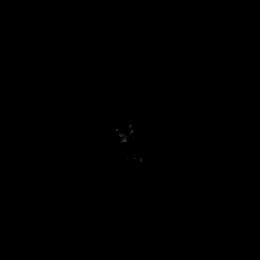

[Series 13: T1 · sagittal · 5.0mm · 0.72mm/px · 2 of 21 slices shown]
[im 1/21]
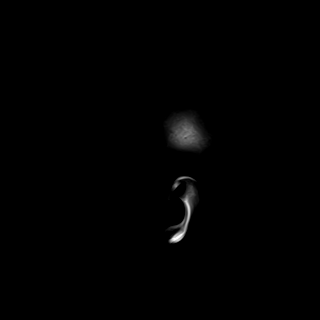
[im 21/21]
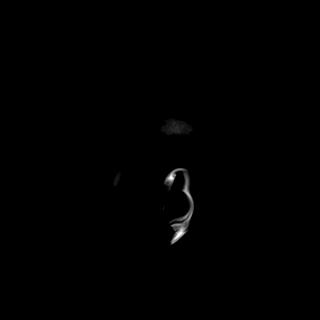

[Series 14: T2 · axial · 5.0mm · 0.72mm/px · z∈[-32,+98]mm · 2 of 22 slices shown (1 of 2)]
[im 1/22]
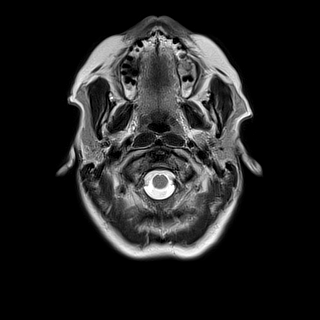
[im 22/22]
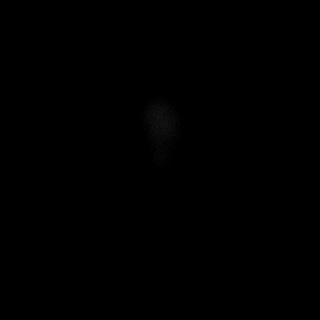

[Series 15: mag_images · axial · 3.0mm · 0.90mm/px · z∈[-35,+100]mm · 4 of 52 slices shown]
[im 1/52]
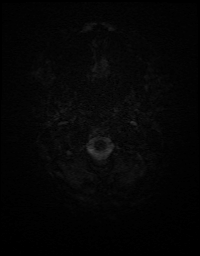
[im 18/52]
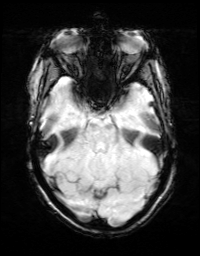
[im 35/52]
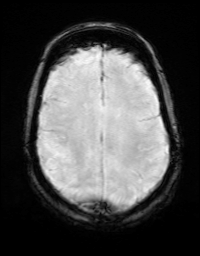
[im 52/52]
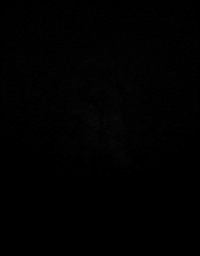

[Series 16: pha_images · axial · 3.0mm · 0.90mm/px · z∈[-35,+95]mm · 4 of 50 slices shown]
[im 1/50]
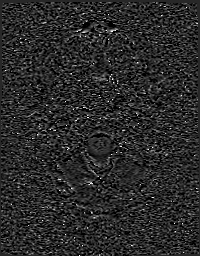
[im 17/50]
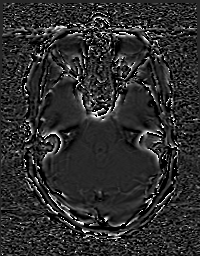
[im 33/50]
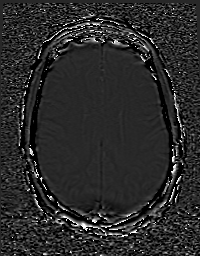
[im 50/50]
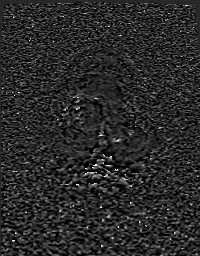

[Series 17: swi_images · axial · 3.0mm · 0.90mm/px · z∈[-35,+100]mm · 4 of 52 slices shown]
[im 1/52]
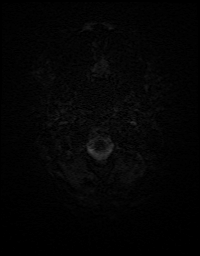
[im 18/52]
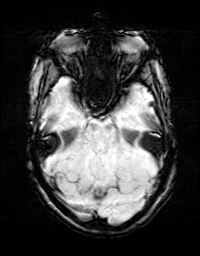
[im 35/52]
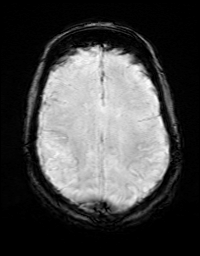
[im 52/52]
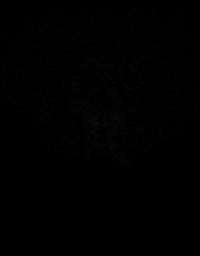

[Series 19: FLAIR · axial · 3.0mm · 0.45mm/px · z∈[-35,+100]mm · 4 of 52 slices shown]
[im 1/52]
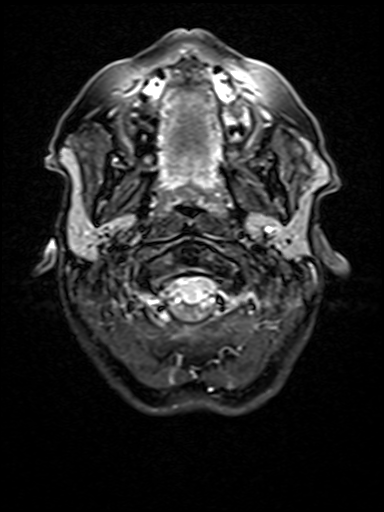
[im 18/52]
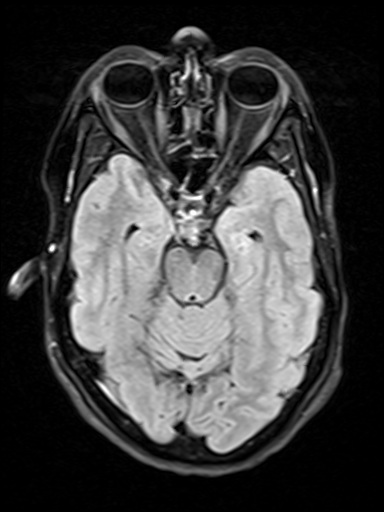
[im 35/52]
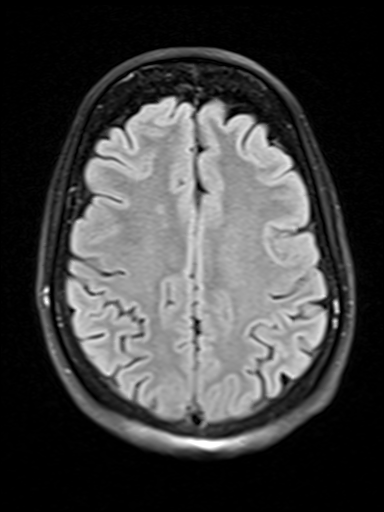
[im 52/52]
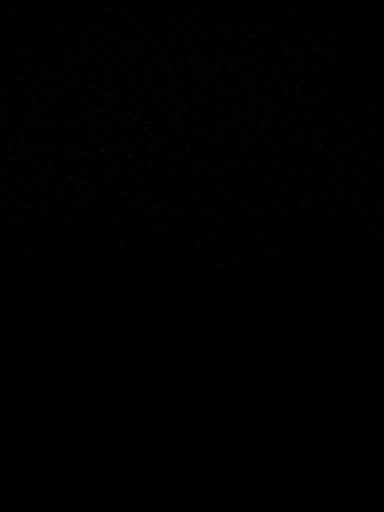

[Series 21: T2 · coronal · 5.0mm · 0.72mm/px · 3 of 34 slices shown (2 of 2)]
[im 1/34]
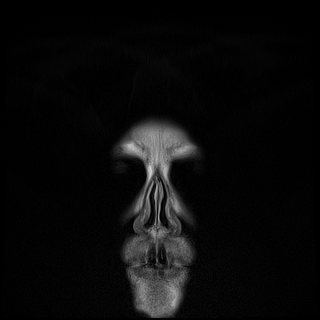
[im 17/34]
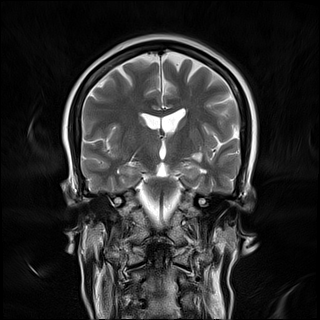
[im 34/34]
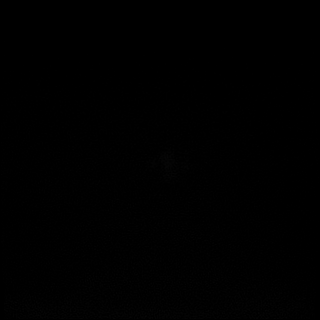

[35 of 48 positions shown; findings below may reference images not displayed]

FINDINGS: Brain:

Cerebral volume appears normal for age.

There are two small foci of T2 FLAIR hyperintense signal abnormality
within the bilateral frontal lobe white matter (measuring up to 5
mm) (series 19, images 36 and 30).

Prominent perivascular space within the inferior left basal ganglia.

5 mm pineal cyst.

A subcentimeter chronic infarct is questioned within the left
cerebellar hemisphere (series 14, image 5).

No cortical encephalomalacia is identified.

There is no acute infarct.

No evidence of an intracranial mass.

No chronic intracranial blood products.

No extra-axial fluid collection.

No midline shift.

Vascular: Maintained flow voids within the proximal large arterial
vessels.

Skull and upper cervical spine: No focal suspicious marrow lesion.

Sinuses/Orbits: Visualized orbits show no acute finding. No
significant paranasal sinus disease.
IMPRESSION: No evidence of acute intracranial abnormality.

Two small foci of T2 FLAIR hyperintense signal abnormality within
the bilateral frontal lobe white matter (measuring up to 5 mm),
nonspecific but likely reflecting minimal changes of chronic small
vessel ischemia.

A subcentimeter chronic infarct is questioned within the left
cerebellar hemisphere.

5 mm pineal cyst, likely incidental.

Cerebral volume appears normal for age.

## 2021-08-10 IMAGING — MR MR KNEE*R* W/O CM
7 series · 40 of 40 positions shown · non-contrast
Comparison: Plain films right knee [DATE].

CLINICAL DATA: Lateral right knee pain and weakness. No known
injury.

EXAM:
MRI OF THE RIGHT KNEE WITHOUT CONTRAST
TECHNIQUE: Multiplanar, multisequence MR imaging of the knee was performed. No
intravenous contrast was administered.

[Series 8: T2 fat-sat · axial · right · 4.0mm · 0.44mm/px · z∈[-104,+51]mm · 8 of 32 slices shown (1 of 3)]
[im 1/32]
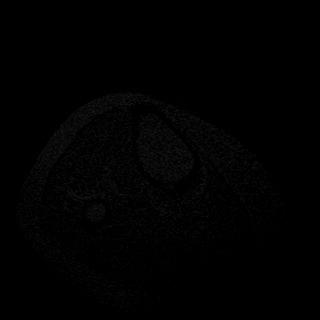
[im 5/32]
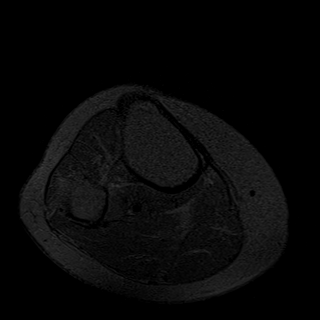
[im 9/32]
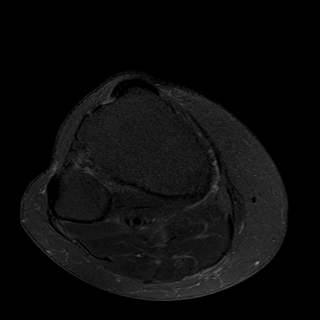
[im 14/32]
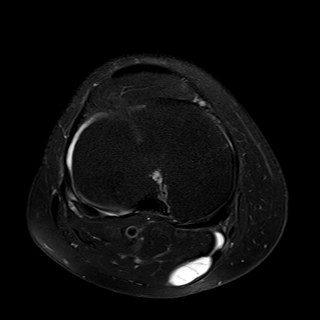
[im 18/32]
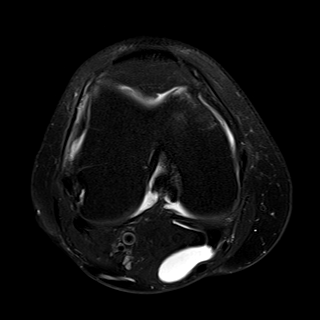
[im 23/32]
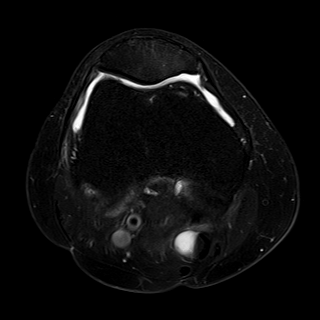
[im 27/32]
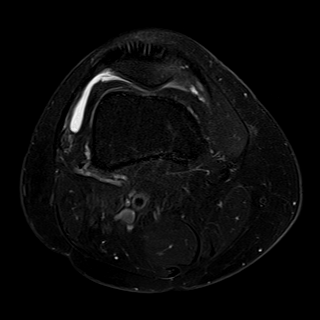
[im 32/32]
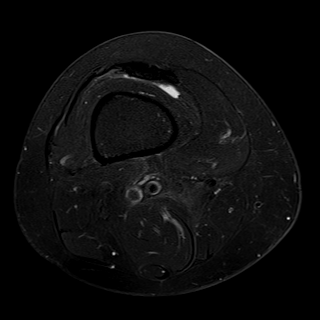

[Series 9: T1 · coronal · right · 4.0mm · 0.59mm/px · 5 of 24 slices shown]
[im 1/24]
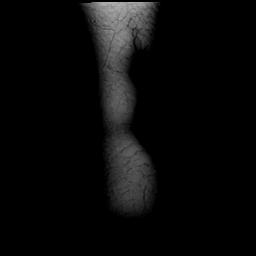
[im 6/24]
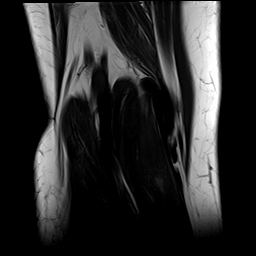
[im 12/24]
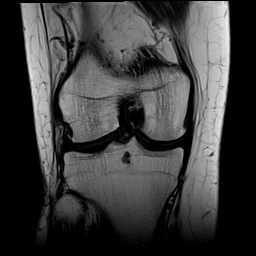
[im 18/24]
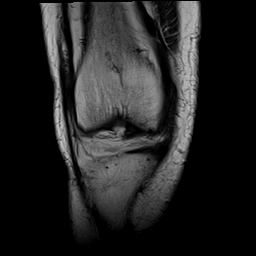
[im 24/24]
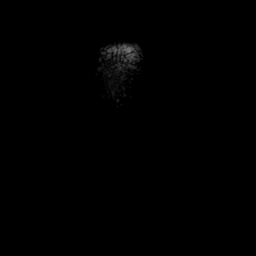

[Series 10: T2 fat-sat · coronal · right · 4.0mm · 0.59mm/px · 5 of 23 slices shown (2 of 3)]
[im 1/23]
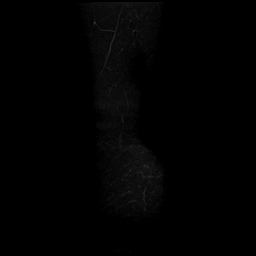
[im 6/23]
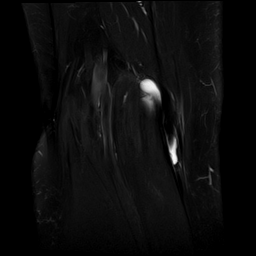
[im 12/23]
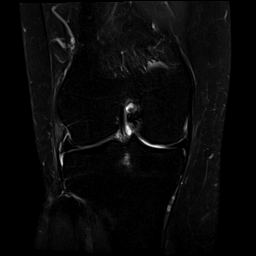
[im 17/23]
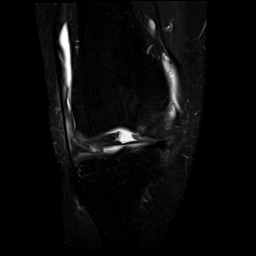
[im 23/23]
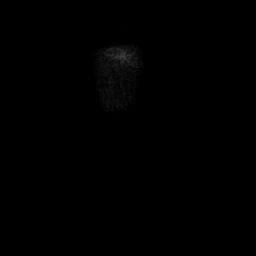

[Series 11: PD fat-sat · coronal · right · 4.0mm · 0.59mm/px · 5 of 24 slices shown (1 of 2)]
[im 1/24]
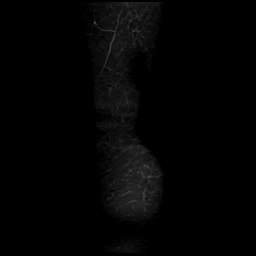
[im 6/24]
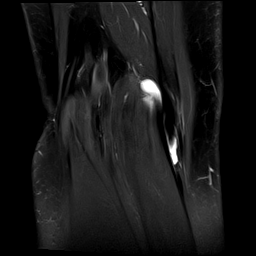
[im 12/24]
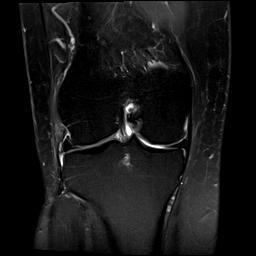
[im 18/24]
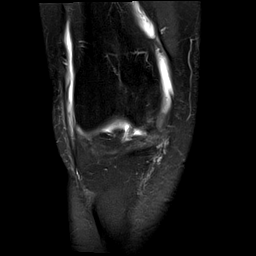
[im 24/24]
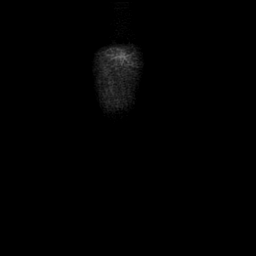

[Series 12: PD fat-sat · sagittal · right · 3.0mm · 0.49mm/px · 6 of 30 slices shown (2 of 2)]
[im 1/30]
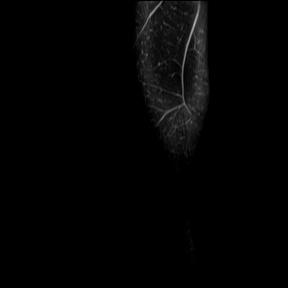
[im 6/30]
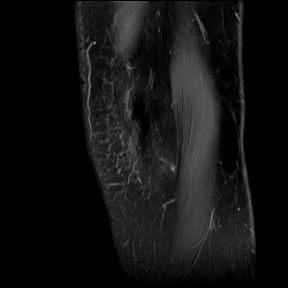
[im 12/30]
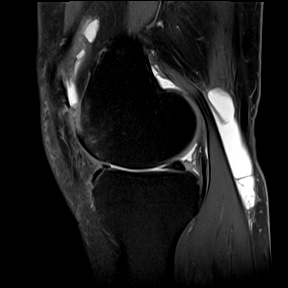
[im 18/30]
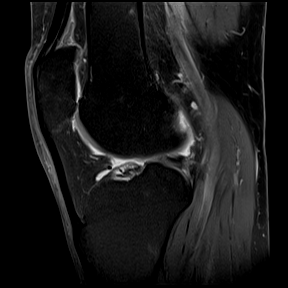
[im 24/30]
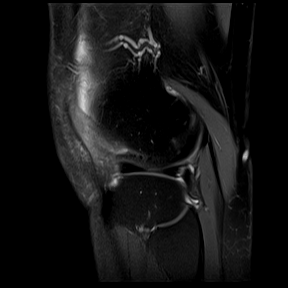
[im 30/30]
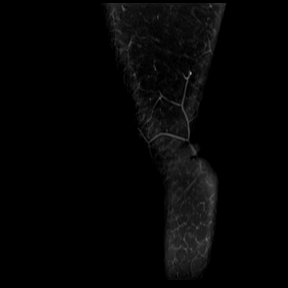

[Series 13: T2 fat-sat · sagittal · right · 3.0mm · 0.55mm/px · 6 of 30 slices shown (3 of 3)]
[im 1/30]
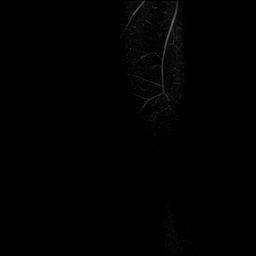
[im 6/30]
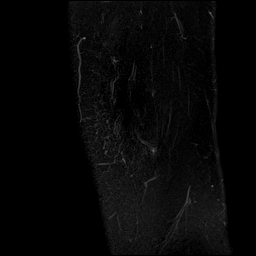
[im 12/30]
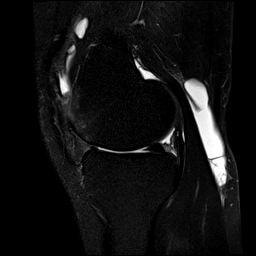
[im 18/30]
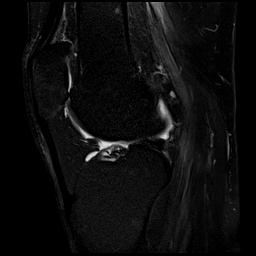
[im 24/30]
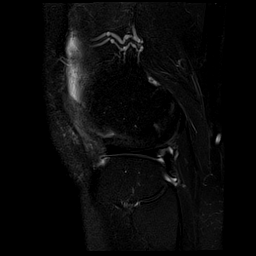
[im 30/30]
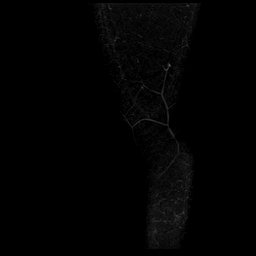

[Series 14: PD · coronal · right · 2.0mm · 0.44mm/px · 5 of 22 slices shown]
[im 1/22]
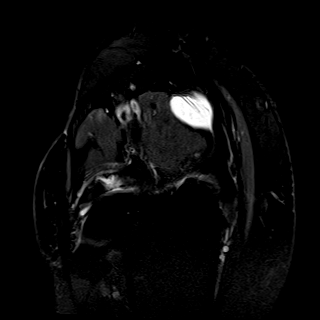
[im 6/22]
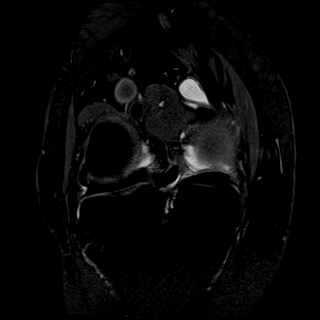
[im 11/22]
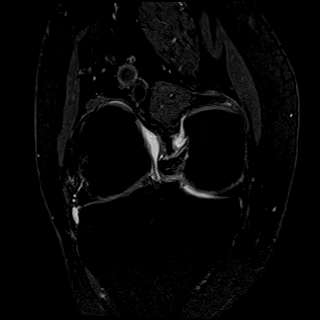
[im 16/22]
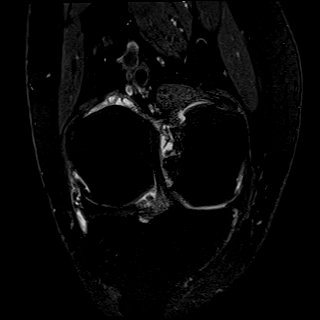
[im 22/22]
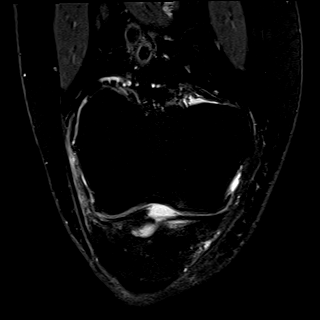

[40 of 40 positions shown; findings below may reference images not displayed]

FINDINGS: MENISCI

Medial meniscus:  Intact.

Lateral meniscus:  Intact.

LIGAMENTS

Cruciates:  Intact.  Mild mucoid degeneration of the ACL noted.

Collaterals:  Intact.

CARTILAGE

Patellofemoral: The patient has advanced patellofemoral degenerative
change. Cartilage along the medial patellar facet and femoral
trochlea is completely denuded and there is associated mild
subchondral edema.

Medial:  Mildly degenerated.

Lateral:  Mildly degenerated.

Joint:  Small effusion.

Popliteal Fossa: Baker's cyst measures 3.1 cm transverse by 1.1 cm
AP by 6.1 cm craniocaudal.

Extensor Mechanism:  Intact.

Bones:  No fracture, stress change or worrisome lesion.

Other: None.
IMPRESSION: Moderately severe appearing patellofemoral osteoarthritis. Mild
medial and lateral compartment degenerative change is also seen.

Negative for meniscal or ligament tear.

Baker's cyst.

## 2021-08-11 ENCOUNTER — Encounter: Payer: Self-pay | Admitting: Family Medicine

## 2021-08-19 DIAGNOSIS — M5416 Radiculopathy, lumbar region: Secondary | ICD-10-CM | POA: Diagnosis not present

## 2021-08-19 DIAGNOSIS — M48061 Spinal stenosis, lumbar region without neurogenic claudication: Secondary | ICD-10-CM | POA: Diagnosis not present

## 2021-08-24 ENCOUNTER — Ambulatory Visit (INDEPENDENT_AMBULATORY_CARE_PROVIDER_SITE_OTHER): Payer: Medicare PPO | Admitting: Family Medicine

## 2021-08-24 ENCOUNTER — Encounter: Payer: Self-pay | Admitting: Family Medicine

## 2021-08-24 VITALS — BP 115/63 | HR 59 | Temp 98.0°F | Ht 66.5 in | Wt 138.4 lb

## 2021-08-24 DIAGNOSIS — E348 Other specified endocrine disorders: Secondary | ICD-10-CM | POA: Diagnosis not present

## 2021-08-24 DIAGNOSIS — Z0001 Encounter for general adult medical examination with abnormal findings: Secondary | ICD-10-CM

## 2021-08-24 DIAGNOSIS — R4589 Other symptoms and signs involving emotional state: Secondary | ICD-10-CM | POA: Diagnosis not present

## 2021-08-24 DIAGNOSIS — F419 Anxiety disorder, unspecified: Secondary | ICD-10-CM

## 2021-08-24 DIAGNOSIS — Z0000 Encounter for general adult medical examination without abnormal findings: Secondary | ICD-10-CM

## 2021-08-24 NOTE — Patient Instructions (Signed)
Preventive Care 65 Years and Older, Female °Preventive care refers to lifestyle choices and visits with your health care provider that can promote health and wellness. Preventive care visits are also called wellness exams. °What can I expect for my preventive care visit? °Counseling °Your health care provider may ask you questions about your: °Medical history, including: °Past medical problems. °Family medical history. °Pregnancy and menstrual history. °History of falls. °Current health, including: °Memory and ability to understand (cognition). °Emotional well-being. °Home life and relationship well-being. °Sexual activity and sexual health. °Lifestyle, including: °Alcohol, nicotine or tobacco, and drug use. °Access to firearms. °Diet, exercise, and sleep habits. °Work and work environment. °Sunscreen use. °Safety issues such as seatbelt and bike helmet use. °Physical exam °Your health care provider will check your: °Height and weight. These may be used to calculate your BMI (body mass index). BMI is a measurement that tells if you are at a healthy weight. °Waist circumference. This measures the distance around your waistline. This measurement also tells if you are at a healthy weight and may help predict your risk of certain diseases, such as type 2 diabetes and high blood pressure. °Heart rate and blood pressure. °Body temperature. °Skin for abnormal spots. °What immunizations do I need? °Vaccines are usually given at various ages, according to a schedule. Your health care provider will recommend vaccines for you based on your age, medical history, and lifestyle or other factors, such as travel or where you work. °What tests do I need? °Screening °Your health care provider may recommend screening tests for certain conditions. This may include: °Lipid and cholesterol levels. °Hepatitis C test. °Hepatitis B test. °HIV (human immunodeficiency virus) test. °STI (sexually transmitted infection) testing, if you are at  risk. °Lung cancer screening. °Colorectal cancer screening. °Diabetes screening. This is done by checking your blood sugar (glucose) after you have not eaten for a while (fasting). °Mammogram. Talk with your health care provider about how often you should have regular mammograms. °BRCA-related cancer screening. This may be done if you have a family history of breast, ovarian, tubal, or peritoneal cancers. °Bone density scan. This is done to screen for osteoporosis. °Talk with your health care provider about your test results, treatment options, and if necessary, the need for more tests. °Follow these instructions at home: °Eating and drinking ° °Eat a diet that includes fresh fruits and vegetables, whole grains, lean protein, and low-fat dairy products. Limit your intake of foods with high amounts of sugar, saturated fats, and salt. °Take vitamin and mineral supplements as recommended by your health care provider. °Do not drink alcohol if your health care provider tells you not to drink. °If you drink alcohol: °Limit how much you have to 0-1 drink a day. °Know how much alcohol is in your drink. In the [place] one drink equals one 12 oz bottle of beer (355 mL), one 5 oz glass of wine (148 mL), or one 1½ oz glass of hard liquor (44 mL). °Lifestyle °Brush your teeth every morning and night with fluoride toothpaste. Floss one time each day. °Exercise for at least 30 minutes 5 or more days each week. °Do not use any products that contain nicotine or tobacco. These products include cigarettes, chewing tobacco, and vaping devices, such as e-cigarettes. If you need help quitting, ask your health care provider. °Do not use drugs. °If you are sexually active, practice safe sex. Use a condom or other form of protection in order to prevent STIs. °Take aspirin only as told by your   health care provider. Make sure that you understand how much to take and what form to take. Work with your health care provider to find out whether it  is safe and beneficial for you to take aspirin daily. Ask your health care provider if you need to take a cholesterol-lowering medicine (statin). Find healthy ways to manage stress, such as: Meditation, yoga, or listening to music. Journaling. Talking to a trusted person. Spending time with friends and family. Minimize exposure to UV radiation to reduce your risk of skin cancer. Safety Always wear your seat belt while driving or riding in a vehicle. Do not drive: If you have been drinking alcohol. Do not ride with someone who has been drinking. When you are tired or distracted. While texting. If you have been using any mind-altering substances or drugs. Wear a helmet and other protective equipment during sports activities. If you have firearms in your house, make sure you follow all gun safety procedures. What's next? Visit your health care provider once a year for an annual wellness visit. Ask your health care provider how often you should have your eyes and teeth checked. Stay up to date on all vaccines. This information is not intended to replace advice given to you by your health care provider. Make sure you discuss any questions you have with your health care provider. Document Revised: 12/16/2020 Document Reviewed: 12/16/2020 Elsevier Patient Education  [place].

## 2021-08-24 NOTE — Progress Notes (Signed)
Joanna Perkins. Mclelland is a 71 y.o. female presents to office today for annual physical exam examination.    Concerns today include: 1.  Poor memory, mood She reports ongoing poor memory.  She wonders if this is related to an undiagnosed COVID infection from last year versus multiple tick bites in the past.  Her mood has gotten better since she started taking B12 and vitamin D each morning.  She is trying to pursue homeopathic remedy for her treatment.  She is done a total purge of all her other supplements as outlined by her naturopath.  She overall does feel better from a mood standpoint.  She identifies needing adequate exercise in efforts to alleviate symptoms as well.  She never did start the Zoloft that we sent in but again feels okay without it.  Occupation: retired, Marital status: married, Substance use: none Diet: balanced, Exercise: regular Last colonoscopy: UTD Last mammogram: UTD Last pap smear: UTD Refills needed today: none Immunizations needed: Immunization History  Administered Date(s) Administered   Fluad Quad(high Dose 65+) 05/15/2019, 03/31/2020, 03/29/2021   Influenza, High Dose Seasonal PF 05/01/2017, 05/02/2018   Moderna Sars-Covid-2 Vaccination 12-18-1950, 09/02/2019, 06/09/2020, 02/02/2021   PNEUMOCOCCAL CONJUGATE-20 03/29/2021   Pneumococcal Conjugate-13 09/12/2018   Td 09/12/2018   Tdap 09/12/2018   Zoster Recombinat (Shingrix) 07/10/2014, 07/06/2018     Past Medical History:  Diagnosis Date   Celiac disease 2008   Social History   Socioeconomic History   Marital status: Married    Spouse name: [name]   Number of children: Not on file   Years of education: Not on file   Highest education level: Not on file  Occupational History   Not on file  Tobacco Use   Smoking status: Former    Packs/day: 1.00    Years: 15.00    Pack years: 15.00    Types: Cigarettes    Quit date: 1987    Years since quitting: 36.1   Smokeless tobacco: Never  Vaping Use    Vaping Use: Never used  Substance and Sexual Activity   Alcohol use: Yes    Alcohol/week: 11.0 standard drinks    Types: 7 Glasses of wine, 4 Cans of beer per week    Comment: daily   Drug use: No   Sexual activity: Not on file  Other Topics Concern   Not on file  Social History Narrative   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 She recently relocated resides with her husband [name].  No biologic children but she does have one stepchild and 2 grandchildren. She prefers holistic approach to her healthcare.   Social Determinants of Health   Financial Resource Strain: Not on file  Food Insecurity: Not on file  Transportation Needs: Not on file  Physical Activity: Not on file  Stress: Not on file  Social Connections: Not on file  Intimate Partner Violence: Not on file   Past Surgical History:  Procedure Laterality Date   COLONOSCOPY     TONSILLECTOMY     Family History  Problem Relation Age of Onset   Asthma Mother    Hypertension Mother    COPD Mother    Allergic rhinitis Mother    Hepatitis C Father    Breast cancer Sister    Celiac disease Sister    Thyroid disease Sister    Celiac disease Brother    Heart attack Maternal Grandfather    Heart attack Paternal  Grandfather    Celiac disease Sister    Thyroid disease Sister    Colon cancer Neg Hx    Colon polyps Neg Hx    Esophageal cancer Neg Hx    Rectal cancer Neg Hx    Eczema Neg Hx    Urticaria Neg Hx     Current Outpatient Medications:    b complex vitamins capsule, Take 1 capsule by mouth daily., Disp: , Rfl:    Calcium-Magnesium-Vitamin D (CALCIUM 500 PO), Take 1 capsule by mouth daily., Disp: , Rfl:    Iodine, Kelp, (KELP PO), Take 1 capsule by mouth daily., Disp: , Rfl:    TURMERIC PO, Take 1 capsule by mouth daily., Disp: , Rfl:   Allergies  Allergen Reactions   Gluten Meal      ROS: Review of Systems Pertinent items noted in HPI and remainder of  comprehensive ROS otherwise negative.    Physical exam BP 115/63    Pulse (!) 59    Temp 98 F (36.7 C)    Ht 5' 6.5" (1.689 m)    Wt 138 lb 6.4 oz (62.8 kg)    SpO2 100%    BMI 22.00 kg/m  General appearance: alert, cooperative, appears stated age, and no distress Head: Normocephalic, without obvious abnormality, atraumatic Eyes: negative findings: lids and lashes normal, conjunctivae and sclerae normal, corneas clear, and pupils equal, round, reactive to light and accomodation Ears: normal TM's and external ear canals both ears Nose: Nares normal. Septum midline. Mucosa normal. No drainage or sinus tenderness. Throat: lips, mucosa, and tongue normal; teeth and gums normal Neck: no adenopathy, no carotid bruit, supple, symmetrical, trachea midline, and thyroid not enlarged, symmetric, no tenderness/mass/nodules Back: symmetric, no curvature. ROM normal. No CVA tenderness. Lungs: clear to auscultation bilaterally Heart: regular rate and rhythm, S1, S2 normal, no murmur, click, rub or gallop Abdomen: soft, non-tender; bowel sounds normal; no masses,  no organomegaly Extremities: extremities normal, atraumatic, no cyanosis or edema Pulses: 2+ and symmetric Skin: Skin color, texture, turgor normal. No rashes or lesions Lymph nodes: Cervical, supraclavicular, and axillary nodes normal. Neurologic: Grossly normal Psych: Mood stable, speech normal, affect appropriate.  Pleasant, interactive  Depression screen [facility] 2/9 08/24/2021 07/27/2021 03/31/2020  Decreased Interest 0 3 0  Down, Depressed, Hopeless 0 - 0  PHQ - 2 Score 0 3 0  Altered sleeping 0 0 0  Tired, decreased energy 0 3 0  Change in appetite 0 0 0  Feeling bad or failure about yourself  0 3 0  Trouble concentrating 0 2 0  Moving slowly or fidgety/restless 0 0 0  Suicidal thoughts 0 1 0  PHQ-9 Score 0 12 0  Difficult doing work/chores Not difficult at all Somewhat difficult -   GAD 7 : Generalized Anxiety Score 08/24/2021  07/27/2021  Nervous, Anxious, on Edge 1 3  Control/stop worrying 0 3  Worry too much - different things 0 3  Trouble relaxing 0 2  Restless 0 2  Easily annoyed or irritable 0 3  Afraid - awful might happen 0 3  Total GAD 7 Score 1 19  Anxiety Difficulty Not difficult at all Somewhat difficult     Assessment/ Plan: Joanna Perkins. Hemric here for annual physical exam.   Annual physical exam  Depressed mood  Anxiety  Cyst of pineal gland  She has had quite a wonderful improvement in her symptoms with the B12 and D supplementation.  I think it is totally fine for her to stay  off pharmacologic interventions as this seems to be working really well for her.  She will continue to follow-up with me if needed for those issues  Pineal gland cyst incidentally observed on MRI of the brain, which was obtained for her memory issues.  She has subsequently been referred to neurology and has an appointment coming up soon with them.  I am eager to see what their recommendations are and if this has any impact on her memory issues  Patient to follow up in 1 year for annual exam or sooner if needed.  Tanzie Rothschild M. Lajuana Ripple, DO

## 2021-09-01 ENCOUNTER — Other Ambulatory Visit: Payer: Self-pay

## 2021-09-01 ENCOUNTER — Encounter (HOSPITAL_BASED_OUTPATIENT_CLINIC_OR_DEPARTMENT_OTHER): Payer: Self-pay | Admitting: Orthopaedic Surgery

## 2021-09-01 ENCOUNTER — Encounter: Payer: Self-pay | Admitting: Family Medicine

## 2021-09-01 ENCOUNTER — Ambulatory Visit (HOSPITAL_BASED_OUTPATIENT_CLINIC_OR_DEPARTMENT_OTHER): Payer: Medicare PPO | Admitting: Orthopaedic Surgery

## 2021-09-01 DIAGNOSIS — M222X1 Patellofemoral disorders, right knee: Secondary | ICD-10-CM | POA: Diagnosis not present

## 2021-09-01 DIAGNOSIS — M25561 Pain in right knee: Secondary | ICD-10-CM

## 2021-09-01 MED ORDER — LIDOCAINE HCL 1 % IJ SOLN
4.0000 mL | INTRAMUSCULAR | Status: AC | PRN
  Administered 2021-09-01: 4 mL

## 2021-09-01 MED ORDER — TRIAMCINOLONE ACETONIDE 40 MG/ML IJ SUSP
80.0000 mg | INTRAMUSCULAR | Status: AC | PRN
  Administered 2021-09-01: 80 mg via INTRA_ARTICULAR

## 2021-09-01 NOTE — Progress Notes (Signed)
? ?                            ? ? ?Chief Complaint: right knee pain ?  ? ? ?History of Present Illness:  ? ?09/01/2021: Presents today for follow-up of the right knee.  She is being currently treated for left hip trochanteric type pain by pain management and believes that this is aggravating the right knee as well.  She is here today for follow-up of the right knee for the MRI that she had done.  She has been trying to row recently and walking on the road which has been flaring up the right knee. ? ?Joanna Perkins is a 71 y.o. female presents with multiple months of right knee pain although worse over the course of the last several weeks.  She did hit this on a bed which caused direct pain.  She also had a jerking type injury where her dog dragged her which caused significant pain in the knee.  She enjoys being very active and is currently retired on a farm with multiple acres.  She has 3 [name]'s dogs which are very big that she enjoys walking.  In terms of treatments she has tried ice, activity restriction, over-the-counter Voltaren, anti-inflammatories.  This only provides transient relief.  She has been using an over-the-counter compression sleeve.  Denies any previous surgery. ? ? ? ?Surgical History:   ?None ? ?PMH/PSH/Family History/Social History/Meds/Allergies:   ? ?Past Medical History:  ?Diagnosis Date  ?? Celiac disease 2008  ? ?Past Surgical History:  ?Procedure Laterality Date  ?? COLONOSCOPY    ?? TONSILLECTOMY    ? ?Social History  ? ?Socioeconomic History  ?? Marital status: Married  ?  Spouse name: [name]  ?? Number of children: Not on file  ?? Years of education: Not on file  ?? Highest education level: Not on file  ?Occupational History  ?? Not on file  ?Tobacco Use  ?? Smoking status: Former  ?  Packs/day: 1.00  ?  Years: 15.00  ?  Pack years: 15.00  ?  Types: Cigarettes  ?  Quit date: 43  ?  Years since quitting: 36.1  ?? Smokeless tobacco: Never  ?Vaping Use  ?? Vaping Use: Never used   ?Substance and Sexual Activity  ?? Alcohol use: Yes  ?  Alcohol/week: 11.0 standard drinks  ?  Types: 7 Glasses of wine, 4 Cans of beer per week  ?  Comment: daily  ?? Drug use: No  ?? Sexual activity: Not on file  ?Other Topics Concern  ?? Not on file  ?Social History Narrative  ? 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 She recently relocated resides with her husband [name].  No biologic children but she does have one stepchild and 2 grandchildren. She prefers holistic approach to her healthcare.  ? ?Social Determinants of Health  ? ?Financial Resource Strain: Not on file  ?Food Insecurity: Not on file  ?Transportation Needs: Not on file  ?Physical Activity: Not on file  ?Stress: Not on file  ?Social Connections: Not on file  ? ?Family History  ?Problem Relation Age of Onset  ?? Asthma Mother   ?? Hypertension Mother   ?? COPD Mother   ?? Allergic rhinitis Mother   ?? Hepatitis C Father   ?? Breast cancer Sister   ?? Celiac disease Sister   ?? Thyroid disease  Sister   ?? Celiac disease Brother   ?? Heart attack Maternal Grandfather   ?? Heart attack Paternal Grandfather   ?? Celiac disease Sister   ?? Thyroid disease Sister   ?? Colon cancer Neg Hx   ?? Colon polyps Neg Hx   ?? Esophageal cancer Neg Hx   ?? Rectal cancer Neg Hx   ?? Eczema Neg Hx   ?? Urticaria Neg Hx   ? ?Allergies  ?Allergen Reactions  ?? Gluten Meal   ? ?Current Outpatient Medications  ?Medication Sig Dispense Refill  ?? b complex vitamins capsule Take 1 capsule by mouth daily.    ?? Calcium-Magnesium-Vitamin D (CALCIUM 500 PO) Take 1 capsule by mouth daily.    ?? Iodine, Kelp, (KELP PO) Take 1 capsule by mouth daily.    ?? TURMERIC PO Take 1 capsule by mouth daily.    ? ?No current facility-administered medications for this visit.  ? ?No results found. ? ?Review of Systems:   ?A ROS was performed including pertinent positives and negatives as documented in the HPI. ? ?Physical Exam :    ?Constitutional: NAD and appears stated age ?Neurological: Alert and oriented ?Psych: Appropriate affect and cooperative ?There were no vitals taken for this visit.  ? ?Comprehensive Musculoskeletal Exam:   ? ?  ?Musculoskeletal Exam  ?Gait Normal  ?Alignment Normal  ? Right Left  ?Inspection Normal Normal  ?Palpation    ?Tenderness Patellofemoral, Gertie's tubercle None  ?Crepitus None None  ?Effusion Trace None  ?Range of Motion    ?Extension 0 0  ?Flexion 135 135  ?Strength    ?Extension 5/5 5/5  ?Flexion 5/5 5/5  ?Ligament Exam     ?Generalized Laxity No No  ?Lachman Negative Negative   ?Pivot Shift Negative Negative  ?Anterior Drawer Negative Negative  ?Valgus at 0 Negative Negative  ?Valgus at 20 Negative Negative  ?Varus at 0 0 0  ?Varus at 20   0 0  ?Posterior Drawer at 90 0 0  ?Vascular/Lymphatic Exam    ?Edema None None  ?Venous Stasis Changes No No  ?Distal Circulation Normal Normal  ?Neurologic    ?Light Touch Sensation Intact Intact  ?Special Tests: Patellofemoral tenderness  ? ? ? ?Imaging:   ?Xray (5 views right knee): ?Very mild lateral compartment osteoarthritis which is minimal otherwise healthy joint spaces ? ?MRI right knee: ?There is mild patellofemoral cartilage loss consistent with a patellofemoral etiology of pain. ?I personally reviewed and interpreted the radiographs. ? ? ?Assessment:   ?71 year old female with right patellofemoral pain as well as pain over the Gertie's tubercle.  I described that these are both consistent with patellofemoral type pain in addition to a IT band tendinitis about the lateral aspect of the knee.  At today's visit I would like to offer a steroid injection into the right knee patellofemoral joint as well as under the IT band laterally.  I described that I would like her to work on hip strengthening of the left and right hip as well as right quad in order to improve her patellofemoral type symptoms.  I will plan to send her to physical therapy for a program for  bilateral hips and quads ?Plan :   ? ?-Plan for physical therapy for bilateral hip strengthening as well as quad strengthening in the setting of right patellofemoral knee pain ? ? ? ? ?Procedure Note ? ?Patient: Dejae Bernet. Martorano             ?Date of Birth:  1951-03-04           ?MRN: [id number]             ?Visit Date: 09/01/2021 ? ?Procedures: ?Visit Diagnoses: No diagnosis found. ? ?Large Joint Inj on 09/01/2021 10:20 AM ?Indications: pain ?Details: 22 G 1.5 in needle, ultrasound-guided anterior approach ? ?Arthrogram: No ? ?Medications: 4 mL lidocaine 1 %; 80 mg triamcinolone acetonide 40 MG/ML ?Outcome: tolerated well, no immediate complications ?Procedure, treatment alternatives, risks and benefits explained, specific risks discussed. Consent was given by the patient. Immediately prior to procedure a time out was called to verify the correct patient, procedure, equipment, support staff and site/side marked as required. Patient was prepped and draped in the usual sterile fashion.  ? ? ? ? ? ? ? ?I personally saw and evaluated the patient, and participated in the management and treatment plan. ? ?[name], MD ?Attending Physician, Orthopedic Surgery ? ?This document was dictated using Systems analyst. A reasonable attempt at proof reading has been made to minimize errors. ?

## 2021-09-03 ENCOUNTER — Ambulatory Visit: Payer: Medicare PPO | Admitting: Neurology

## 2021-09-03 ENCOUNTER — Other Ambulatory Visit: Payer: Self-pay

## 2021-09-03 ENCOUNTER — Encounter: Payer: Self-pay | Admitting: Neurology

## 2021-09-03 ENCOUNTER — Encounter: Payer: Self-pay | Admitting: Family Medicine

## 2021-09-03 VITALS — BP 125/69 | HR 81 | Ht 66.5 in | Wt 137.0 lb

## 2021-09-03 DIAGNOSIS — G930 Cerebral cysts: Secondary | ICD-10-CM | POA: Diagnosis not present

## 2021-09-03 NOTE — Progress Notes (Signed)
? ?Chief Complaint  ?Patient presents with  ? New Patient (Initial Visit)  ?  Room 14 - alone. 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 She would like to further review her MRI brain results ordered by her PCP.  ? ? ? ? ?ASSESSMENT AND PLAN ? ?Joanna Panning. Perkins is a 71 y.o. female   ?Mild cognitive impairment ? MoCA examination 2330 on September 03, 2021, ? MRI of the brain showed incidental left basal ganglion cyst, chronic, no mass effect, ? Laboratory evaluation showed no treatable etiology ? Discussed with patient, will not repeat MRI of the brain unless new symptoms appear, encouraged to continue moderate exercise, be social and mentally active, ? ? ?DIAGNOSTIC DATA (LABS, IMAGING, TESTING) ?- I reviewed patient records, labs, notes, testing and imaging myself where available. ? ? ?MEDICAL HISTORY: ? ?Joanna Panning. Perkins, is a 71 year old female seen in request by her primary care physician Dr. Lajuana Ripple, [facility], for evaluation of memory loss, initial evaluation was September 03, 2020 ? ?I reviewed and summarized the referring note. PMHX ?Celiac disease ? ?Patient is a retired Programmer, applications at [employer], has been physically active all her life, still very physically active, reported lifelong history of gluten sensitivity, carries a diagnosis of celiac disease, but never biopsy-proven. ? ?Over the years, with her field work, she suffered multiple tick bite, was treated for Lyme disease in the past, seeing a homeopathic physician at [place], over the years, she has occasionally flareup of possible Lyme disease, often presented as confusion, memory loss, mild fever ? ?At the end of 2022, she noticed intermittent memory loss, confusion spells, hard to find most efficient way to finish multitask, this triggered MRI of the brain I personally reviewed the film from August 10, 2021, no acute abnormality, age-appropriate at atrophy, round smooth margin cystic lesion at left basal ganglion, most  likely has been present all her life ? ?She denies focal signs, with her joint issue improved, she become more physically active, her memory has improved, today's MoCA examination 23/30, she has missed 5 out of 5 short-term recall. ? ?Laboratory evaluation in January 2023 showed normal CBC, TSH B12, CMP ? ? ?PHYSICAL EXAM: ?  ?Vitals:  ? 09/03/21 0909  ?BP: 125/69  ?Pulse: 81  ?Weight: 137 lb (62.1 kg)  ?Height: 5' 6.5" (1.689 m)  ? ?Not recorded ?  ? ? ?Body mass index is 21.78 kg/m?. ? ?PHYSICAL EXAMNIATION: ? ?Gen: NAD, conversant, well nourised, well groomed                     ?Cardiovascular: Regular rate rhythm, no peripheral edema, warm, nontender. ?Eyes: Conjunctivae clear without exudates or hemorrhage ?Neck: Supple, no carotid bruits. ?Pulmonary: Clear to auscultation bilaterally  ? ?NEUROLOGICAL EXAM: ? ?MENTAL STATUS: ?Speech: ?   Speech is normal; fluent and spontaneous with normal comprehension.  ?Cognition: ?    Orientation to time, place and person ?    Normal recent and remote memory ?    Normal Attention span and concentration ?    Normal Language, naming, repeating,spontaneous speech ?    Fund of knowledge ?  ?CRANIAL NERVES: ?CN II: Visual fields are full to confrontation. Pupils are round equal and briskly reactive to light. ?CN III, IV, VI: extraocular movement are normal. No ptosis. ?CN V: Facial sensation is intact to light touch ?CN VII: Face is symmetric with normal eye closure  ?CN VIII: Hearing is normal to causal conversation. ?CN  IX, X: Phonation is normal. ?CN XI: Head turning and shoulder shrug are intact ? ?MOTOR: ?There is no pronator drift of out-stretched arms. Muscle bulk and tone are normal. Muscle strength is normal. ? ?REFLEXES: ?Reflexes are 2+ and symmetric at the biceps, triceps, knees, and ankles. Plantar responses are flexor. ? ?SENSORY: ?Intact to light touch, pinprick and vibratory sensation are intact in fingers and toes. ? ?COORDINATION: ?There is no trunk or limb  dysmetria noted. ? ?GAIT/STANCE: ?Posture is normal. Gait is steady with normal steps, base, arm swing, and turning. Heel and toe walking are normal. Tandem gait is normal.  ?Romberg is absent. ? ?REVIEW OF SYSTEMS:  ?Full 14 system review of systems performed and notable only for as above ?All other review of systems were negative. ? ? ?ALLERGIES: ?Allergies  ?Allergen Reactions  ? Gluten Meal   ? ? ?HOME MEDICATIONS: ?Current Outpatient Medications  ?Medication Sig Dispense Refill  ? b complex vitamins capsule Take 1 capsule by mouth daily.    ? Calcium-Magnesium-Vitamin D (CALCIUM 500 PO) Take 1 capsule by mouth daily.    ? Iodine, Kelp, (KELP PO) Take 1 capsule by mouth daily.    ? TURMERIC PO Take 1 capsule by mouth daily.    ? VITAMIN D PO Take 1 tablet by mouth daily.    ? ?No current facility-administered medications for this visit.  ? ? ?PAST MEDICAL HISTORY: ?Past Medical History:  ?Diagnosis Date  ? Celiac disease 2008  ? Cyst of pineal gland   ? ? ?PAST SURGICAL HISTORY: ?Past Surgical History:  ?Procedure Laterality Date  ? COLONOSCOPY    ? TONSILLECTOMY    ? ? ?FAMILY HISTORY: ?Family History  ?Problem Relation Age of Onset  ? Asthma Mother   ? Hypertension Mother   ? COPD Mother   ? Allergic rhinitis Mother   ? Hepatitis C Father   ? Breast cancer Sister   ? Celiac disease Sister   ? Thyroid disease Sister   ? Celiac disease Brother   ? Heart attack Maternal Grandfather   ? Heart attack Paternal Grandfather   ? Celiac disease Sister   ? Thyroid disease Sister   ? Colon cancer Neg Hx   ? Colon polyps Neg Hx   ? Esophageal cancer Neg Hx   ? Rectal cancer Neg Hx   ? Eczema Neg Hx   ? Urticaria Neg Hx   ? ? ?SOCIAL HISTORY: ?Social History  ? ?Socioeconomic History  ? Marital status: Married  ?  Spouse name: [name]  ? Number of children: 0  ? Years of education: college  ? Highest education level: Doctorate  ?Occupational History  ? Occupation: Retired  ?Tobacco Use  ? Smoking status: Former  ?   Packs/day: 1.00  ?  Years: 15.00  ?  Pack years: 15.00  ?  Types: Cigarettes  ?  Quit date: 72  ?  Years since quitting: 36.1  ? Smokeless tobacco: Never  ?Vaping Use  ? Vaping Use: Never used  ?Substance and Sexual Activity  ? Alcohol use: Not Currently  ? Drug use: No  ? Sexual activity: Not on file  ?Other Topics Concern  ? Not on file  ?Social History Narrative  ? 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 She recently relocated resides with her husband [name].  No biologic children but she does have one stepchild and 2 grandchildren. She prefers holistic approach to her healthcare.  ?  Right-handed.  ? Tea each morning, occasional coffee.  ? ?Social Determinants of Health  ? ?Financial Resource Strain: Not on file  ?Food Insecurity: Not on file  ?Transportation Needs: Not on file  ?Physical Activity: Not on file  ?Stress: Not on file  ?Social Connections: Not on file  ?Intimate Partner Violence: Not on file  ? ? ? ? ?Marcial Pacas, M.D. Ph.D. ? ?[facility] ?[address], [address] ?[place], [place] [address] ?Ph: [phone] ?Fax: [phone] ? ?CC:  Baruch Gouty, FNP ?[place],  [place] [id number]  [name], DO   ?

## 2021-09-04 ENCOUNTER — Encounter: Payer: Self-pay | Admitting: Family Medicine

## 2021-09-06 ENCOUNTER — Encounter: Payer: Self-pay | Admitting: Family Medicine

## 2021-09-08 ENCOUNTER — Encounter: Payer: Self-pay | Admitting: Family Medicine

## 2021-09-09 ENCOUNTER — Encounter: Payer: Self-pay | Admitting: Family Medicine

## 2021-09-13 ENCOUNTER — Other Ambulatory Visit: Payer: Self-pay | Admitting: Family Medicine

## 2021-09-13 ENCOUNTER — Encounter: Payer: Self-pay | Admitting: Family Medicine

## 2021-09-13 DIAGNOSIS — R6889 Other general symptoms and signs: Secondary | ICD-10-CM

## 2021-09-14 ENCOUNTER — Other Ambulatory Visit: Payer: Medicare PPO

## 2021-09-14 DIAGNOSIS — R6889 Other general symptoms and signs: Secondary | ICD-10-CM | POA: Diagnosis not present

## 2021-09-15 ENCOUNTER — Encounter: Payer: Self-pay | Admitting: Family Medicine

## 2021-09-15 LAB — THYROID PANEL WITH TSH
Free Thyroxine Index: 3 (ref 1.2–4.9)
T3 Uptake Ratio: 40 % — ABNORMAL HIGH (ref 24–39)
T4, Total: 7.4 ug/dL (ref 4.5–12.0)
TSH: 1.34 u[IU]/mL (ref 0.450–4.500)

## 2021-09-16 ENCOUNTER — Encounter (HOSPITAL_BASED_OUTPATIENT_CLINIC_OR_DEPARTMENT_OTHER): Payer: Self-pay | Admitting: Orthopaedic Surgery

## 2021-09-28 ENCOUNTER — Encounter: Payer: Self-pay | Admitting: Family Medicine

## 2021-09-28 ENCOUNTER — Ambulatory Visit: Payer: Medicare PPO | Admitting: Neurology

## 2021-10-20 ENCOUNTER — Ambulatory Visit (HOSPITAL_BASED_OUTPATIENT_CLINIC_OR_DEPARTMENT_OTHER): Payer: Medicare PPO | Admitting: Orthopaedic Surgery

## 2021-10-20 ENCOUNTER — Ambulatory Visit (HOSPITAL_BASED_OUTPATIENT_CLINIC_OR_DEPARTMENT_OTHER)
Admission: RE | Admit: 2021-10-20 | Discharge: 2021-10-20 | Disposition: A | Discharge status: Home / Self Care | Payer: Medicare PPO | Source: Ambulatory Visit | Attending: Orthopaedic Surgery | Admitting: Orthopaedic Surgery

## 2021-10-20 DIAGNOSIS — M25551 Pain in right hip: Secondary | ICD-10-CM | POA: Diagnosis not present

## 2021-10-20 DIAGNOSIS — M25552 Pain in left hip: Secondary | ICD-10-CM

## 2021-10-20 DIAGNOSIS — M67959 Unspecified disorder of synovium and tendon, unspecified thigh: Secondary | ICD-10-CM

## 2021-10-20 IMAGING — DX DG HIP (WITH OR WITHOUT PELVIS) 4+V*L*
4 series · 4 of 4 positions shown · non-contrast
Comparison: None.

CLINICAL DATA: Left posterior hip pain.

EXAM:
DG HIP (WITH OR WITHOUT PELVIS) 4+V LEFT

[hip ap]
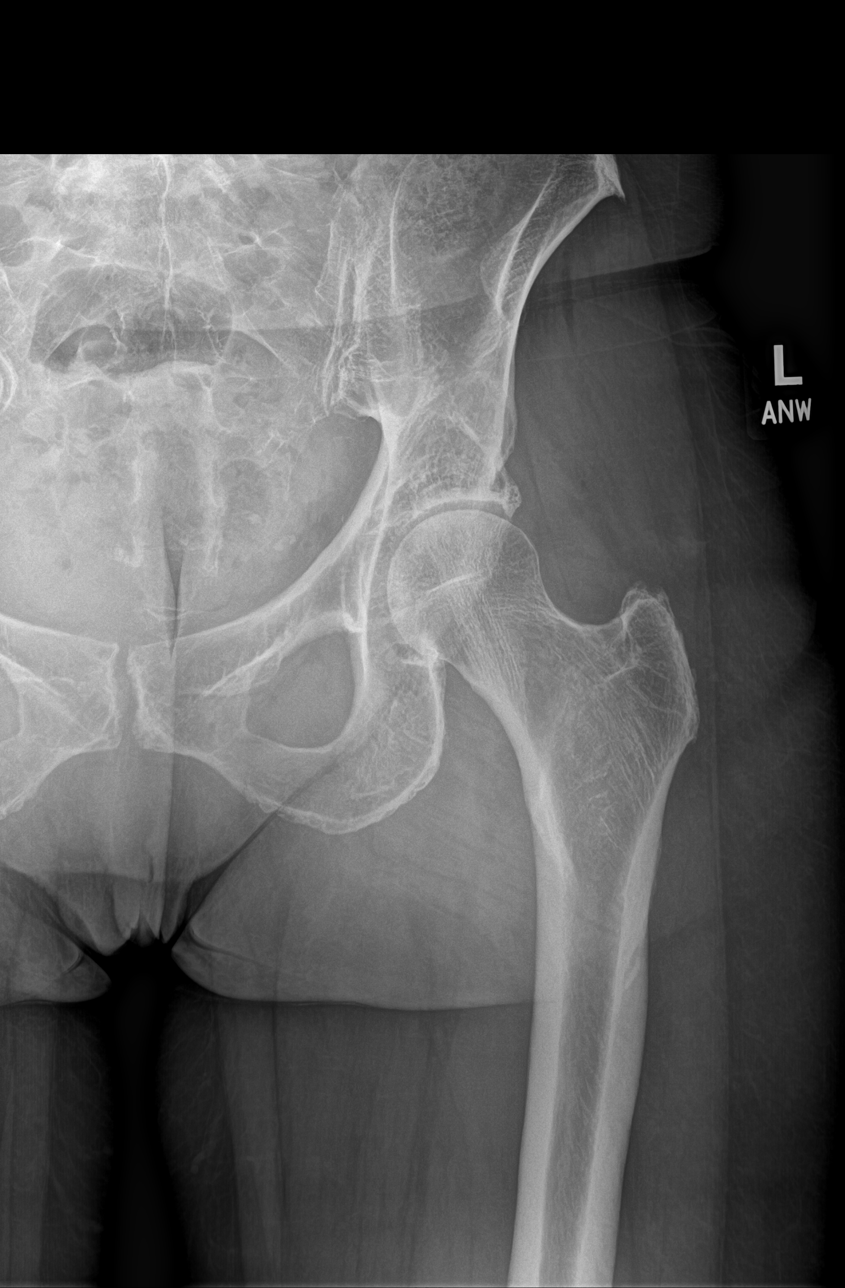

[hip lat]
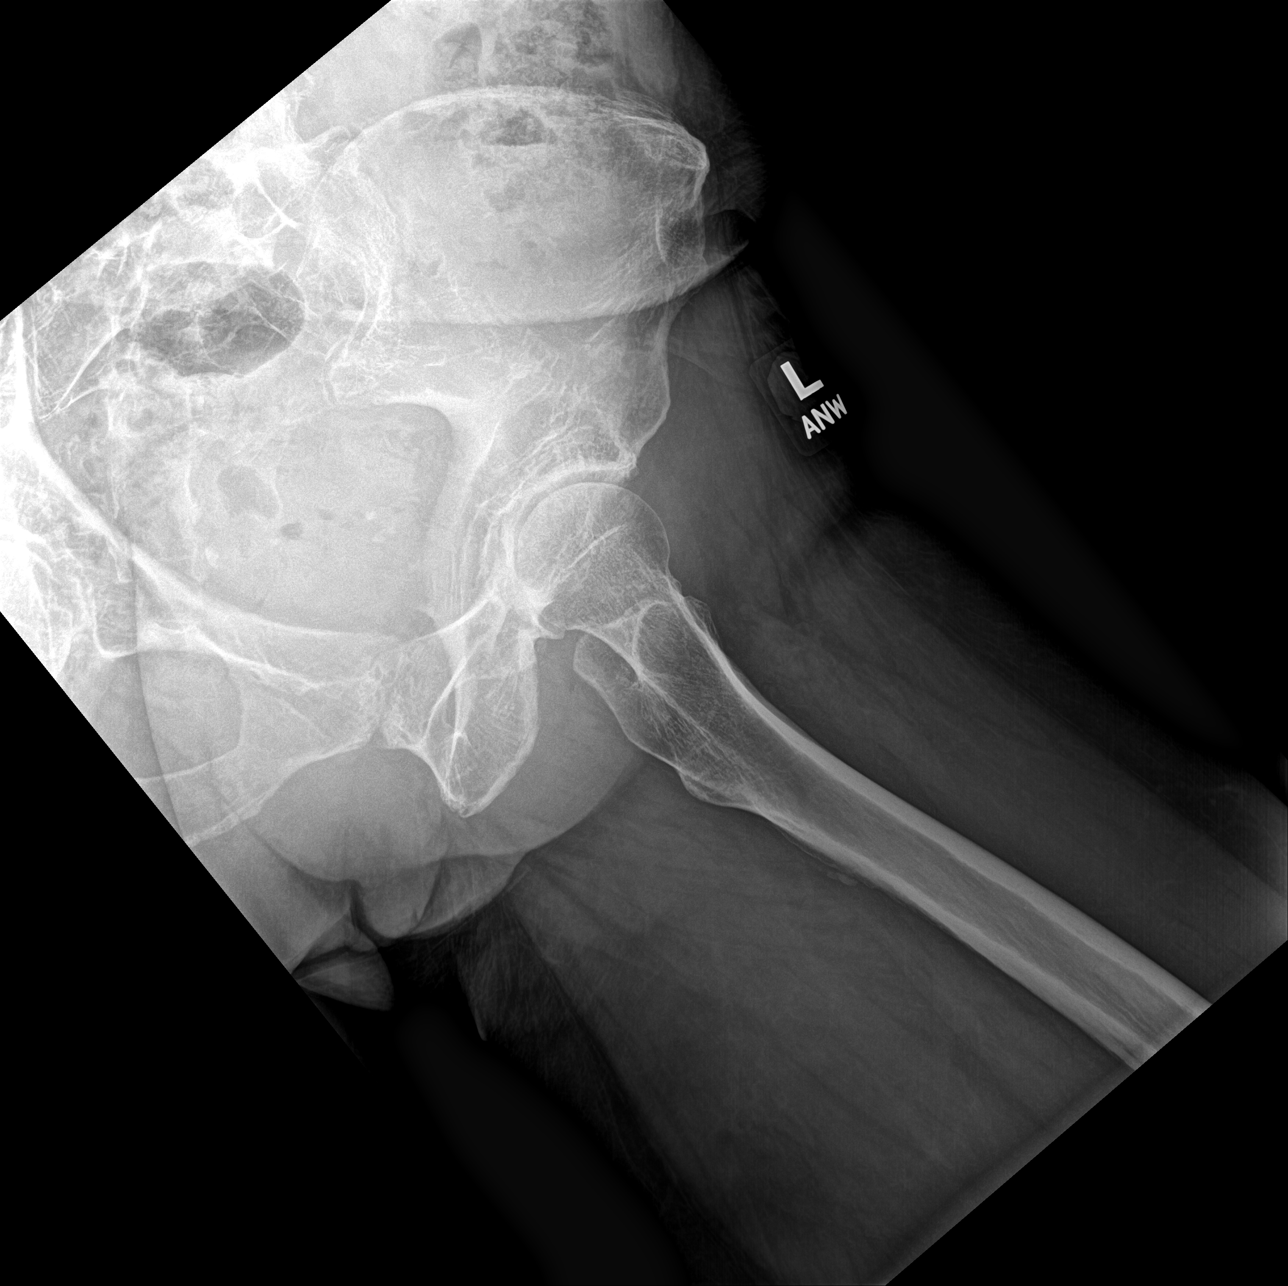

[pelvis ap]
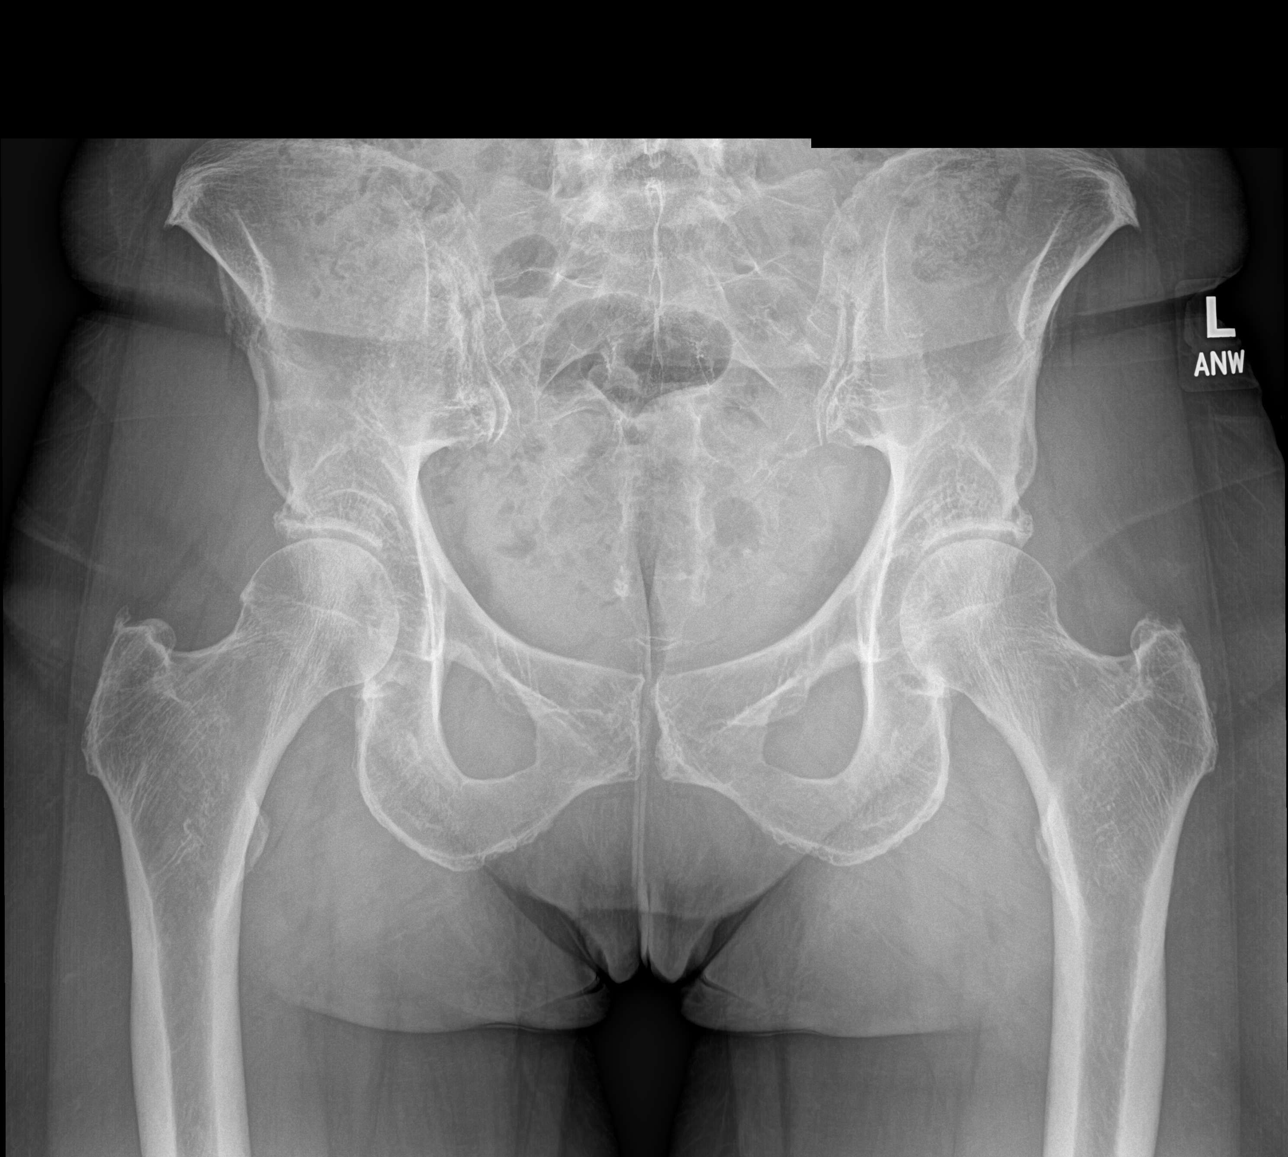

[hip axial]
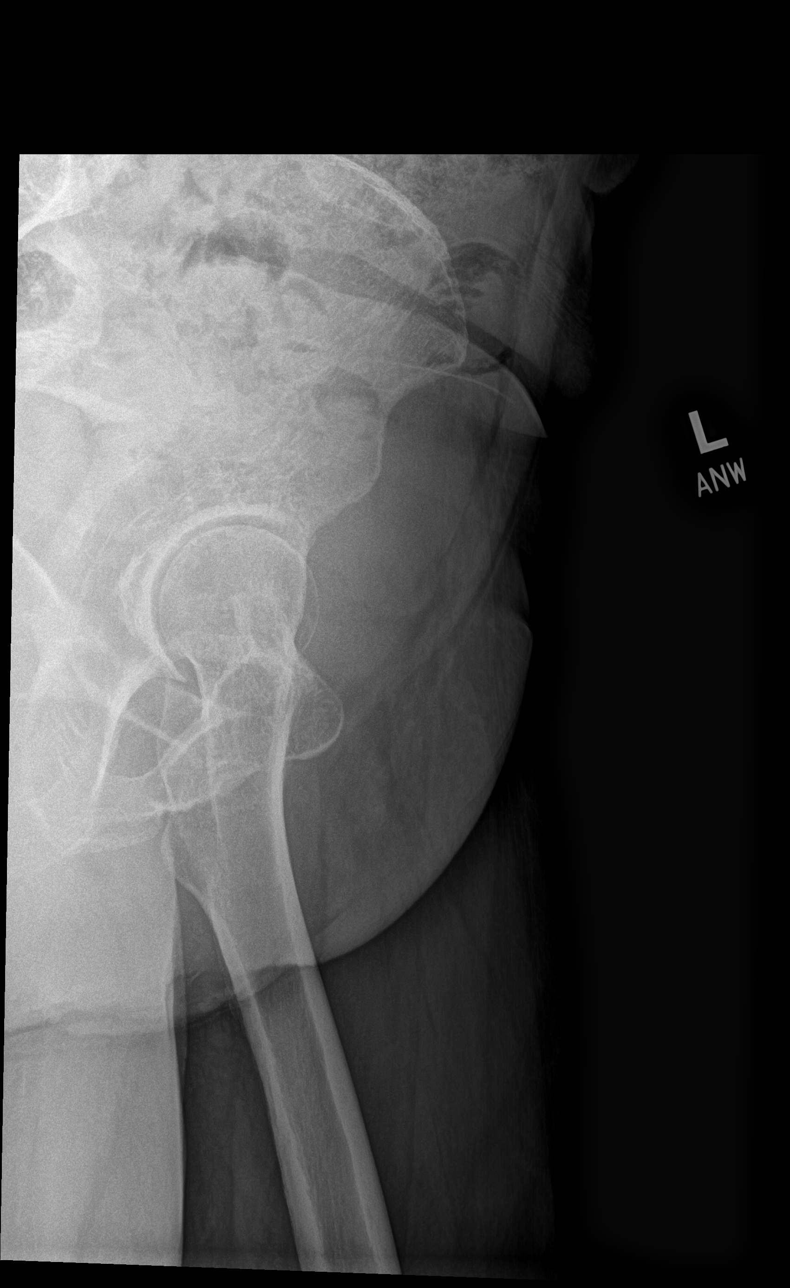

[4 of 4 positions shown; findings below may reference images not displayed]

FINDINGS: There is no evidence of hip fracture or dislocation. There is no
evidence of arthropathy or other focal bone abnormality.
IMPRESSION: Negative.

## 2021-10-20 MED ORDER — LIDOCAINE HCL 1 % IJ SOLN
4.0000 mL | INTRAMUSCULAR | Status: AC | PRN
  Administered 2021-10-20: 4 mL

## 2021-10-20 MED ORDER — TRIAMCINOLONE ACETONIDE 40 MG/ML IJ SUSP
80.0000 mg | INTRAMUSCULAR | Status: AC | PRN
  Administered 2021-10-20: 80 mg via INTRA_ARTICULAR

## 2021-10-20 NOTE — Progress Notes (Signed)
? ?                            ? ? ?Chief Complaint: Left hip pain ?  ? ? ?History of Present Illness:  ? ?10/20/2021: Presents today for follow-up of her left hip and right knee.  Overall she states that the right knee feels much better after injection.  She essentially has no pain in the left hip.  She is having pain going up and down stairs.  She remains very active and enjoys doing yoga daily although continues to experience lateral based hip pain.  She has previously underwent physical therapy for strengthening about her hip.  This did not provide her any type of significant relief.  She has also seen a pain management doctor performed a hip injection.  She is not able to specify where this was specifically performed although she did not get significant relief from it.  She continues to walk with a somewhat weak gait.  ? ?Joanna Perkins is a 71 y.o. female presents with multiple months of right knee pain although worse over the course of the last several weeks.  She did hit this on a bed which caused direct pain.  She also had a jerking type injury where her dog dragged her which caused significant pain in the knee.  She enjoys being very active and is currently retired on a farm with multiple acres.  She has 3 [name]'s dogs which are very big that she enjoys walking.  In terms of treatments she has tried ice, activity restriction, over-the-counter Voltaren, anti-inflammatories.  This only provides transient relief.  She has been using an over-the-counter compression sleeve.  Denies any previous surgery. ? ? ? ?Surgical History:   ?None ? ?PMH/PSH/Family History/Social History/Meds/Allergies:   ? ?Past Medical History:  ?Diagnosis Date  ? Celiac disease 2008  ? Cyst of pineal gland   ? ?Past Surgical History:  ?Procedure Laterality Date  ? COLONOSCOPY    ? TONSILLECTOMY    ? ?Social History  ? ?Socioeconomic History  ? Marital status: Married  ?  Spouse name: [name]  ? Number of children: 0  ? Years of  education: college  ? Highest education level: Doctorate  ?Occupational History  ? Occupation: Retired  ?Tobacco Use  ? Smoking status: Former  ?  Packs/day: 1.00  ?  Years: 15.00  ?  Pack years: 15.00  ?  Types: Cigarettes  ?  Quit date: 38  ?  Years since quitting: 36.3  ? Smokeless tobacco: Never  ?Vaping Use  ? Vaping Use: Never used  ?Substance and Sexual Activity  ? Alcohol use: Not Currently  ? Drug use: No  ? Sexual activity: Not on file  ?Other Topics Concern  ? Not on file  ?Social History Narrative  ? 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 She recently relocated resides with her husband [name].  No biologic children but she does have one stepchild and 2 grandchildren. She prefers holistic approach to her healthcare.  ? Right-handed.  ? Tea each morning, occasional coffee.  ? ?Social Determinants of Health  ? ?Financial Resource Strain: Not on file  ?Food Insecurity: Not on file  ?Transportation Needs: Not on file  ?Physical Activity: Not on file  ?Stress: Not on file  ?Social Connections: Not on file  ? ?Family History  ?Problem Relation Age of Onset  ?  Asthma Mother   ? Hypertension Mother   ? COPD Mother   ? Allergic rhinitis Mother   ? Hepatitis C Father   ? Breast cancer Sister   ? Celiac disease Sister   ? Thyroid disease Sister   ? Celiac disease Brother   ? Heart attack Maternal Grandfather   ? Heart attack Paternal Grandfather   ? Celiac disease Sister   ? Thyroid disease Sister   ? Colon cancer Neg Hx   ? Colon polyps Neg Hx   ? Esophageal cancer Neg Hx   ? Rectal cancer Neg Hx   ? Eczema Neg Hx   ? Urticaria Neg Hx   ? ?Allergies  ?Allergen Reactions  ? Gluten Meal   ? ?Current Outpatient Medications  ?Medication Sig Dispense Refill  ? b complex vitamins capsule Take 1 capsule by mouth daily.    ? Calcium-Magnesium-Vitamin D (CALCIUM 500 PO) Take 1 capsule by mouth daily.    ? Iodine, Kelp, (KELP PO) Take 1 capsule by mouth daily.    ? TURMERIC PO  Take 1 capsule by mouth daily.    ? VITAMIN D PO Take 1 tablet by mouth daily.    ? ?No current facility-administered medications for this visit.  ? ?No results found. ? ?Review of Systems:   ?A ROS was performed including pertinent positives and negatives as documented in the HPI. ? ?Physical Exam :   ?Constitutional: NAD and appears stated age ?Neurological: Alert and oriented ?Psych: Appropriate affect and cooperative ?There were no vitals taken for this visit.  ? ?Comprehensive Musculoskeletal Exam:   ? ?  ?Musculoskeletal Exam  ?Gait Normal  ?Alignment Normal  ? Right Left  ?Inspection Normal Normal  ?Palpation    ?Tenderness none None  ?Crepitus None None  ?Effusion Trace None  ?Range of Motion    ?Extension 0 0  ?Flexion 135 135  ?Strength    ?Extension 5/5 5/5  ?Flexion 5/5 5/5  ?Ligament Exam     ?Generalized Laxity No No  ?Lachman Negative Negative   ?Pivot Shift Negative Negative  ?Anterior Drawer Negative Negative  ?Valgus at 0 Negative Negative  ?Valgus at 20 Negative Negative  ?Varus at 0 0 0  ?Varus at 20   0 0  ?Posterior Drawer at 90 0 0  ?Vascular/Lymphatic Exam    ?Edema None None  ?Venous Stasis Changes No No  ?Distal Circulation Normal Normal  ?Neurologic    ?Light Touch Sensation Intact Intact  ?Special Tests:   ? ? ? ?Inspection Right Left  ?Skin No atrophy or gross abnormalities appreciated No atrophy or gross abnormalities appreciated  ?Palpation    ?Tenderness none Greater trochanter  ?Crepitus None None  ?Range of Motion    ?Flexion (passive) 120 120  ?Extension 30 30  ?IR 35 35  ?ER 45 45  ?Strength    ?Flexion  5/5 5/5  ?Extension 5/5 5/5  ?Special Tests    ?FABER Negative Negative  ?FADIR Negative Negative  ?ER Lag/Capsular Insufficiency Negative Negative  ?Instability Negative Negative  ?Sacroiliac pain Negative  Negative   ?Instability    ?Generalized Laxity No No  ?Neurologic    ?sciatic, femoral, obturator nerves intact to light sensation  ?Vascular/Lymphatic    ?DP pulse 2+ 2+   ?Lumbar Exam    ?Patient has symmetric lumbar range of motion with negative pain referral to hip  ? ? ? ?Imaging:   ?Xray (AP pelvis, 3 views left hip): ?There is mild joint space narrowing  of the femoral acetabular joint ? ? ?I personally reviewed and interpreted the radiographs. ? ? ?Assessment:   ?71 year old female with left hip pain consistent with ongoing gluteus medius possible tearing.  X-rays today do show mild her symptoms appear to be emanating predominantly from the gluteus muscles laterally.  To that effect I do believe an MRI would be helpful to further assess her femoral acetabular joint and to rule out any type of tearing involving the gluteus musculature.  Given the fact that she does have some arthritis I have also recommended a ultrasound-guided injection of the left gluteus medius muscle.  Specifically I think this will help ascertain what percentage of her symptoms are coming from this as opposed to the femoral acetabular joint. ? ? ?Arthritis of the hip joint althoughPlan :   ? ?-Left hip ultrasound-guided gluteus medius injection performed after verbal consent obtained ?-Return to clinic following MRI for further discussion ? ? ? ? ?Procedure Note ? ?[name]             ?Date of Birth: 12-29-1950           ?MRN: [id number]             ?Visit Date: 10/20/2021 ? ?Procedures: ?Visit Diagnoses:  ?1. Pain of left hip   ? ? ?Large Joint Inj: L greater trochanter on 10/20/2021 11:43 AM ?Indications: pain ?Details: 22 G 3.5 in needle, ultrasound-guided anterolateral approach ? ?Arthrogram: No ? ?Medications: 4 mL lidocaine 1 %; 80 mg triamcinolone acetonide 40 MG/ML ?Outcome: tolerated well, no immediate complications ?Procedure, treatment alternatives, risks and benefits explained, specific risks discussed. Consent was given by the patient. Immediately prior to procedure a time out was called to verify the correct patient, procedure, equipment, support staff and site/side marked as  required. Patient was prepped and draped in the usual sterile fashion.  ? ? ? ? ? ? ? ?I personally saw and evaluated the patient, and participated in the management and treatment plan. ? ?[name], MD ?[place]

## 2021-10-25 ENCOUNTER — Encounter (HOSPITAL_BASED_OUTPATIENT_CLINIC_OR_DEPARTMENT_OTHER): Payer: Self-pay | Admitting: Orthopaedic Surgery

## 2021-10-25 ENCOUNTER — Encounter: Payer: Self-pay | Admitting: Family Medicine

## 2021-10-25 ENCOUNTER — Ambulatory Visit: Payer: Medicare PPO | Admitting: Family Medicine

## 2021-10-25 VITALS — BP 129/84 | HR 63 | Temp 98.2°F | Ht 66.5 in | Wt 137.4 lb

## 2021-10-25 DIAGNOSIS — F419 Anxiety disorder, unspecified: Secondary | ICD-10-CM | POA: Diagnosis not present

## 2021-10-25 DIAGNOSIS — W57XXXD Bitten or stung by nonvenomous insect and other nonvenomous arthropods, subsequent encounter: Secondary | ICD-10-CM

## 2021-10-25 DIAGNOSIS — G479 Sleep disorder, unspecified: Secondary | ICD-10-CM | POA: Diagnosis not present

## 2021-10-25 MED ORDER — DOXYCYCLINE HYCLATE 100 MG PO TABS: ORAL_TABLET | ORAL | 1 refills | Status: DC

## 2021-10-25 MED ORDER — LORAZEPAM 0.5 MG PO TABS: 0.2500 mg | ORAL_TABLET | Freq: Every evening | ORAL | 1 refills | Status: DC | PRN

## 2021-10-25 NOTE — Progress Notes (Signed)
? ?Subjective: ?CC: Follow-up mood, memory ?PCP: [name], DO ?QHU:TMLYY Joanna Perkins is a 71 y.o. female presenting to clinic today for: ? ?1.  Mood, memory, insomnia/ tick bite ?Patient continues to have difficulty sleeping.  She uses natural therapies with various degrees of success.  She admits that sometimes her mind does not shut off but she is trying to work through the mood stuff on her own.  Does not drink alcohol.  When she does drink its alcohol free beers.  She continues to stay extremely physically active and unfortunately continues to sustain tick bites.  She is currently treating herself with doxycycline for tick bite that she had 3 days ago.  No reports of joint pain that are new, fevers or rashes. ? ? ?ROS: Per HPI ? ?Allergies  ?Allergen Reactions  ? Gluten Meal   ? ?Past Medical History:  ?Diagnosis Date  ? Celiac disease 2008  ? Cyst of pineal gland   ? ? ?Current Outpatient Medications:  ?  b complex vitamins capsule, Take 1 capsule by mouth daily., Disp: , Rfl:  ?  Calcium-Magnesium-Vitamin D (CALCIUM 500 PO), Take 1 capsule by mouth daily., Disp: , Rfl:  ?  Iodine, Kelp, (KELP PO), Take 1 capsule by mouth daily., Disp: , Rfl:  ?  TURMERIC PO, Take 1 capsule by mouth daily., Disp: , Rfl:  ?  VITAMIN D PO, Take 1 tablet by mouth daily., Disp: , Rfl:  ?Social History  ? ?Socioeconomic History  ? Marital status: Married  ?  Spouse name: [name]  ? Number of children: 0  ? Years of education: college  ? Highest education level: Doctorate  ?Occupational History  ? Occupation: Retired  ?Tobacco Use  ? Smoking status: Former  ?  Packs/day: 1.00  ?  Years: 15.00  ?  Pack years: 15.00  ?  Types: Cigarettes  ?  Quit date: 29  ?  Years since quitting: 36.3  ? Smokeless tobacco: Never  ?Vaping Use  ? Vaping Use: Never used  ?Substance and Sexual Activity  ? Alcohol use: Not Currently  ? Drug use: No  ? Sexual activity: Not on file  ?Other Topics Concern  ? Not on file  ?Social History Narrative  ?  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 She recently relocated resides with her husband [name].  No biologic children but she does have one stepchild and 2 grandchildren. She prefers holistic approach to her healthcare.  ? Right-handed.  ? Tea each morning, occasional coffee.  ? ?Social Determinants of Health  ? ?Financial Resource Strain: Not on file  ?Food Insecurity: Not on file  ?Transportation Needs: Not on file  ?Physical Activity: Not on file  ?Stress: Not on file  ?Social Connections: Not on file  ?Intimate Partner Violence: Not on file  ? ?Family History  ?Problem Relation Age of Onset  ? Asthma Mother   ? Hypertension Mother   ? COPD Mother   ? Allergic rhinitis Mother   ? Hepatitis C Father   ? Breast cancer Sister   ? Celiac disease Sister   ? Thyroid disease Sister   ? Celiac disease Brother   ? Heart attack Maternal Grandfather   ? Heart attack Paternal Grandfather   ? Celiac disease Sister   ? Thyroid disease Sister   ? Colon cancer Neg Hx   ? Colon polyps Neg Hx   ? Esophageal cancer Neg Hx   ?  Rectal cancer Neg Hx   ? Eczema Neg Hx   ? Urticaria Neg Hx   ? ? ?Objective: ?Office vital signs reviewed. ?BP 129/84   Pulse 63   Temp 98.2 ?F (36.8 ?C)   Ht 5' 6.5" (1.689 m)   Wt 137 lb 6.4 oz (62.3 kg)   SpO2 99%   BMI 21.84 kg/m?  ? ?Physical Examination:  ?General: Awake, alert, well nourished, No acute distress ?HEENT: sclera [place], MMM ?Cardio: regular rate and rhythm, S1S2 heard, no murmurs appreciated ?Pulm: normal WOB on room air ?Psych: mood stable, speech normal ? ?  10/25/2021  ?  1:07 PM 08/24/2021  ?  8:19 AM 07/27/2021  ? 10:40 AM  ?Depression screen PHQ 2/9  ?Decreased Interest 0 0 3  ?Down, Depressed, Hopeless 0 0   ?PHQ - 2 Score 0 0 3  ?Altered sleeping 2 0 0  ?Tired, decreased energy 1 0 3  ?Change in appetite 0 0 0  ?Feeling bad or failure about yourself  0 0 3  ?Trouble concentrating 0 0 2  ?Moving slowly or fidgety/restless 0 0 0   ?Suicidal thoughts 0 0 1  ?PHQ-9 Score 3 0 12  ?Difficult doing work/chores Not difficult at all Not difficult at all Somewhat difficult  ? ? ?  10/25/2021  ?  1:08 PM 08/24/2021  ?  8:19 AM 07/27/2021  ? 10:40 AM  ?GAD 7 : Generalized Anxiety Score  ?Nervous, Anxious, on Edge 0 1 3  ?Control/stop worrying 1 0 3  ?Worry too much - different things 1 0 3  ?Trouble relaxing 0 0 2  ?Restless 0 0 2  ?Easily annoyed or irritable 1 0 3  ?Afraid - awful might happen 1 0 3  ?Total GAD 7 Score 4 1 19   ?Anxiety Difficulty Somewhat difficult Not difficult at all Somewhat difficult  ? ? ? ? ?Assessment/ Plan: ?71 y.o. female  ? ?Anxiety - Plan: LORazepam (ATIVAN) 0.5 MG tablet ? ?Sleep difficulties - Plan: LORazepam (ATIVAN) 0.5 MG tablet ? ?Tick bite, unspecified site, subsequent encounter - Plan: doxycycline (VIBRA-TABS) 100 MG tablet ? ?Ativan given for as needed use.  Advised to use extremely sparingly.  We discussed the risks of benzodiazepine use, particularly chronically.  She voiced good understanding.  No apparent contraindications to use.  Caution sedation.  We will plan for controlled substance contract and urine drug screen at next visit if she continues this medication going forward ? ?I have sent in doxycycline for prophylactic use.  Discussed how to use prophylactically. ? ?No orders of the defined types were placed in this encounter. ? ?No orders of the defined types were placed in this encounter. ? ? ?The Narcotic Database has been reviewed.  There were no red flags.   ? ?[name], DO ?[place] ?[phone] ? ?

## 2021-10-30 ENCOUNTER — Other Ambulatory Visit: Payer: Medicare PPO

## 2021-11-04 ENCOUNTER — Ambulatory Visit
Admission: RE | Admit: 2021-11-04 | Discharge: 2021-11-04 | Disposition: A | Discharge status: Home / Self Care | Payer: Medicare PPO | Source: Ambulatory Visit | Attending: Orthopaedic Surgery | Admitting: Orthopaedic Surgery

## 2021-11-04 DIAGNOSIS — M25552 Pain in left hip: Secondary | ICD-10-CM | POA: Diagnosis not present

## 2021-11-04 IMAGING — MR MR HIP*L* W/O CM
4 of 5 series · 29 of 40 positions shown · non-contrast
Comparison: Radiographs [DATE]

CLINICAL DATA: Chronic left hip pain. Left lateral thigh numbness
and left leg weakness.

EXAM:
MR OF THE LEFT HIP WITHOUT CONTRAST
TECHNIQUE: Multiplanar, multisequence MR imaging was performed. No intravenous
contrast was administered.

[Series 3: T2 fat-sat · coronal · 4.0mm · 1.19mm/px · 9 of 33 slices shown (1 of 2)]
[im 1/33]
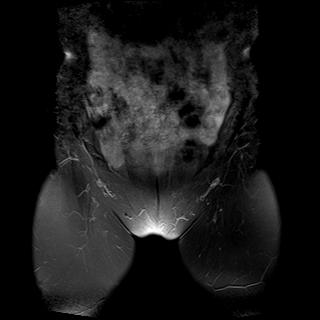
[im 5/33]
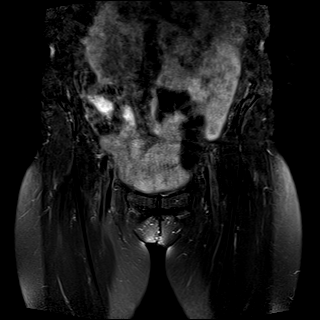
[im 9/33]
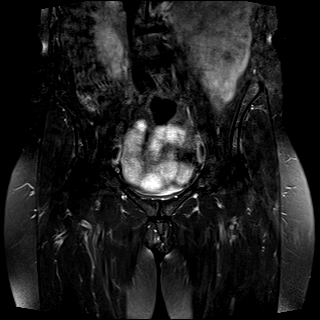
[im 13/33]
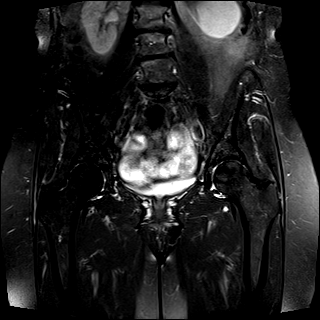
[im 17/33]
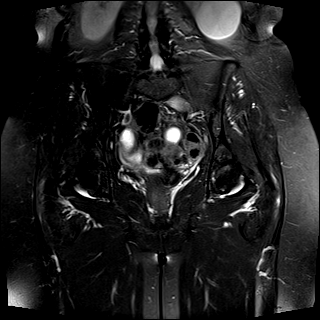
[im 21/33]
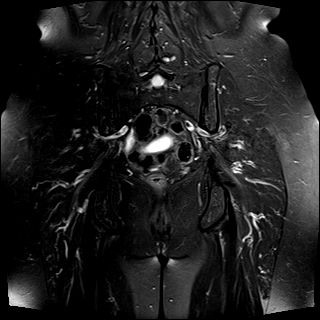
[im 25/33]
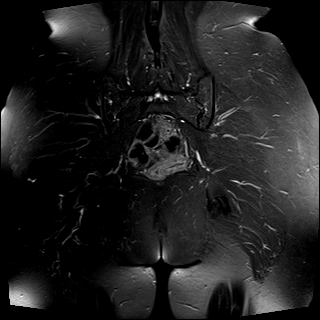
[im 29/33]
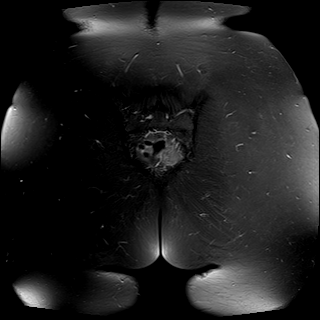
[im 33/33]
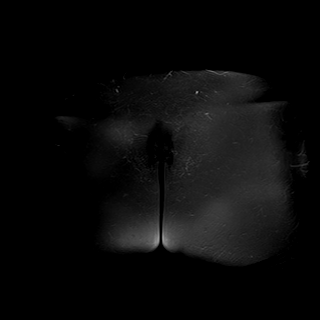

[Series 4: T1 · coronal · 4.0mm · 1.19mm/px · 9 of 33 slices shown]
[im 1/33]
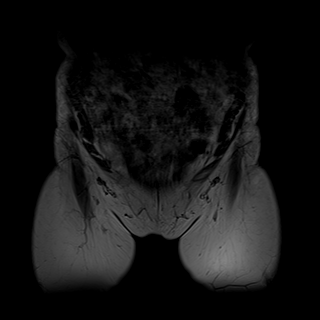
[im 5/33]
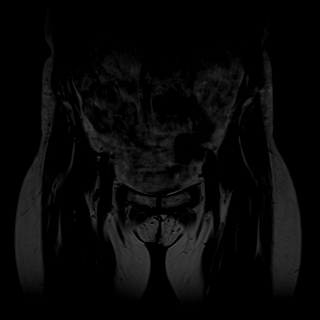
[im 9/33]
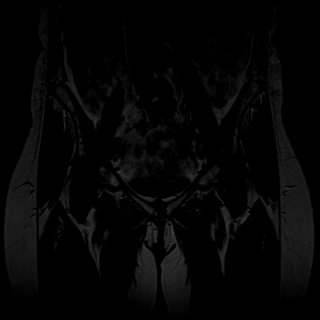
[im 13/33]
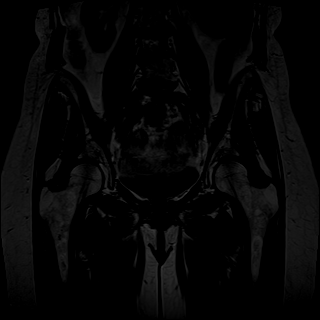
[im 17/33]
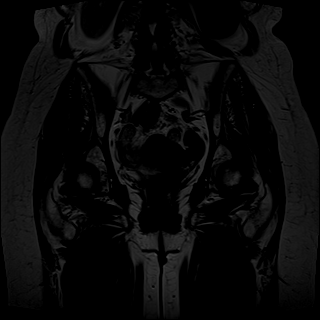
[im 21/33]
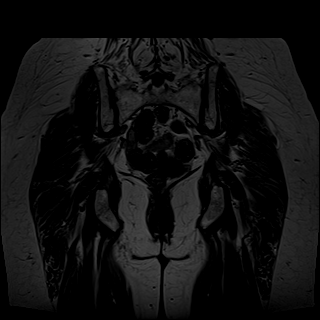
[im 25/33]
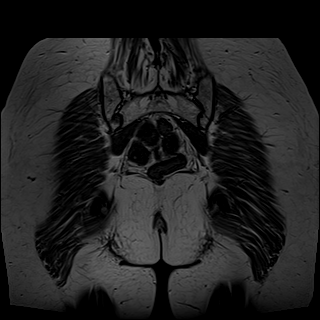
[im 29/33]
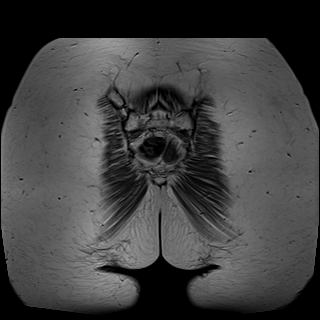
[im 33/33]
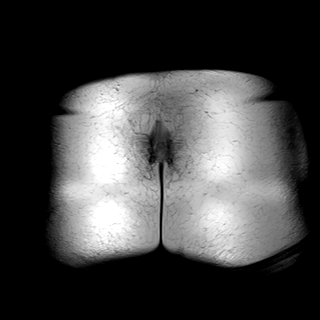

[Series 5: T2 fat-sat · axial · 4.0mm · 0.62mm/px · z∈[-80,+40]mm · 8 of 30 slices shown (2 of 2)]
[im 1/30]
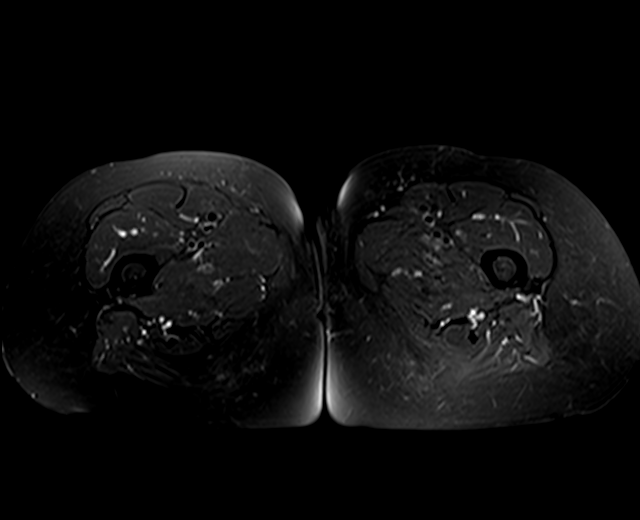
[im 4/30]
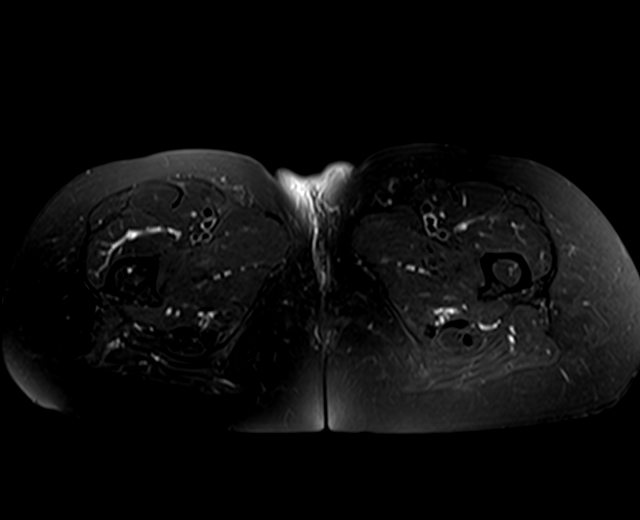
[im 8/30]
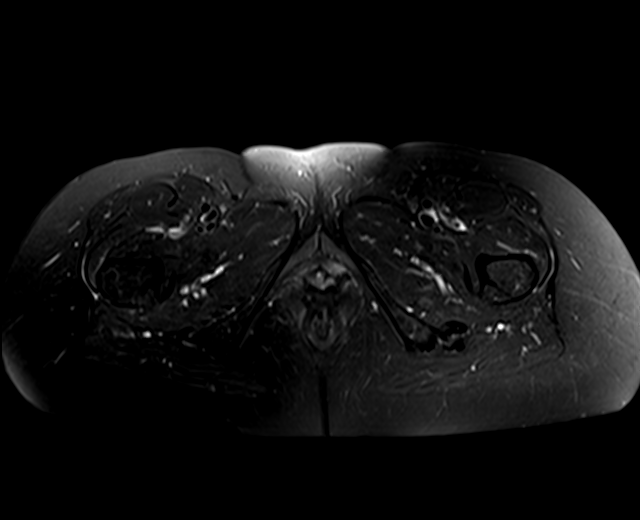
[im 11/30]
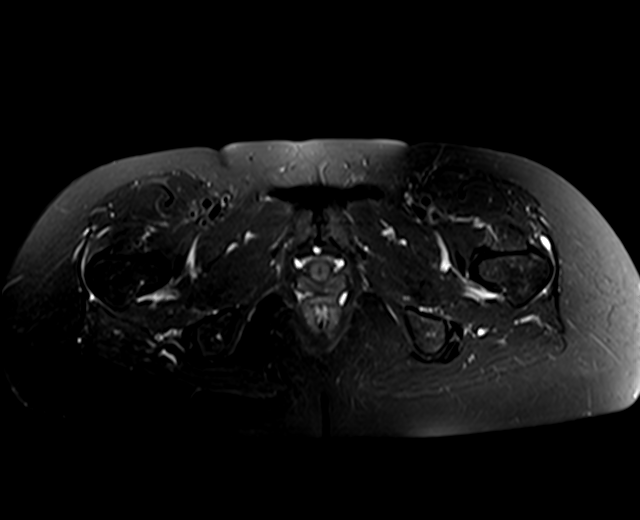
[im 15/30]
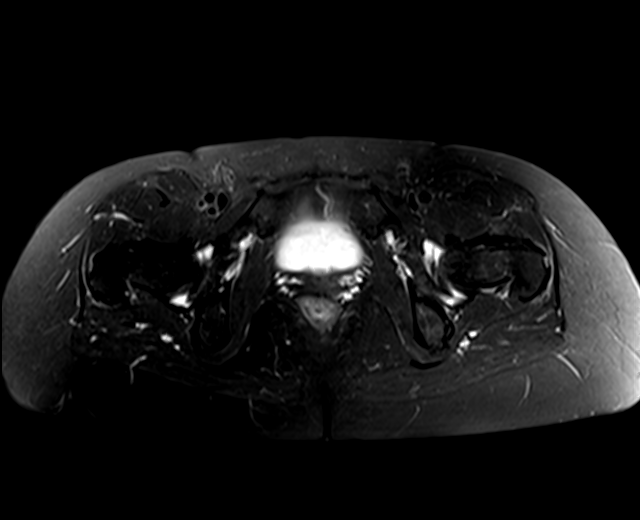
[im 19/30]
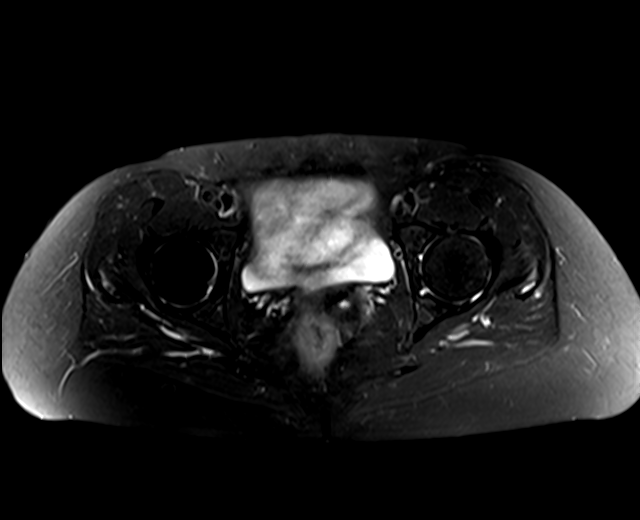
[im 22/30]
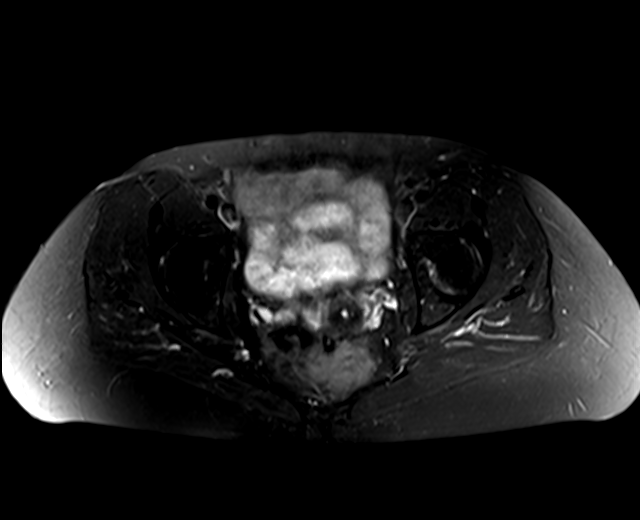
[im 26/30]
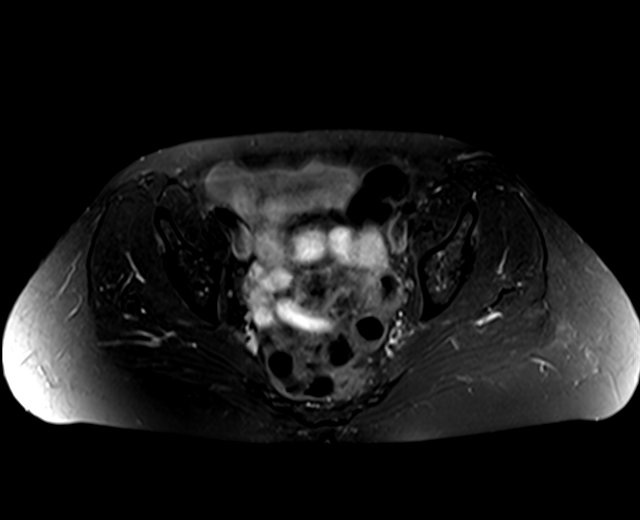

[Series 6: PD fat-sat · sagittal · 4.0mm · 0.70mm/px · 3 of 24 slices shown]
[im 4/24]
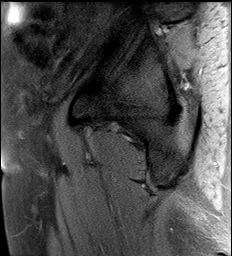
[im 12/24]
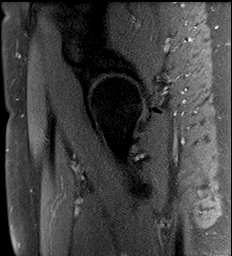
[im 20/24]
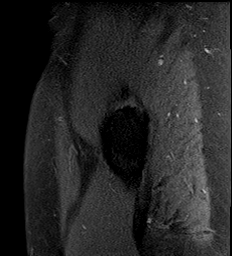

[29 of 40 positions shown; findings below may reference images not displayed]

FINDINGS: Bones: Multilevel degenerative endplate findings in lumbar spine
associated with intervertebral spurring, disc desiccation, and loss
of disc height at L3-4, L4-5, and L5-S1. This is similar to the
findings shown on [DATE] lumbar spine MRI.

Articular cartilage and labrum

Articular cartilage: Moderate axial and mild craniocaudad articular
cartilage thinning.

Labrum:  Grossly unremarkable

Joint or bursal effusion

Joint effusion:  Absent

Bursae: Trace left trochanteric bursitis.

Muscles and tendons

Muscles and tendons:  Mild distal left gluteus medius tendinopathy.

Other findings

Miscellaneous: We partially include a fluid signal intensity lesion
of the left kidney upper pole has also shown on the prior lumbar
spine MRI from [DATE], visualized portion appears homogeneous,
this is most likely a cyst but is only partially characterized on
today's exam.
IMPRESSION: 1. Moderate axial and mild craniocaudad degenerative chondral
thinning in the left hip, without hip effusion.
2. Trace left trochanteric bursitis.
3. Minimal distal left gluteus medius tendinopathy.
4. Lower lumbar spondylosis and degenerative disc disease with
multilevel degenerative endplate findings as shown on prior lumbar
spine MRI.
5. Left kidney lower pole lesion with simple fluid signal intensity
in the included portion, likely a cyst.

## 2021-11-09 ENCOUNTER — Encounter: Payer: Self-pay | Admitting: Family Medicine

## 2021-11-24 ENCOUNTER — Ambulatory Visit (HOSPITAL_BASED_OUTPATIENT_CLINIC_OR_DEPARTMENT_OTHER): Payer: Medicare PPO | Admitting: Orthopaedic Surgery

## 2021-11-29 ENCOUNTER — Encounter: Payer: Self-pay | Admitting: Family Medicine

## 2021-11-30 ENCOUNTER — Ambulatory Visit: Payer: Medicare PPO | Admitting: Family Medicine

## 2021-12-03 ENCOUNTER — Ambulatory Visit (HOSPITAL_BASED_OUTPATIENT_CLINIC_OR_DEPARTMENT_OTHER): Payer: Medicare PPO | Admitting: Orthopaedic Surgery

## 2021-12-03 DIAGNOSIS — M7582 Other shoulder lesions, left shoulder: Secondary | ICD-10-CM | POA: Diagnosis not present

## 2021-12-03 DIAGNOSIS — M25512 Pain in left shoulder: Secondary | ICD-10-CM | POA: Diagnosis not present

## 2021-12-03 MED ORDER — LIDOCAINE HCL 1 % IJ SOLN
4.0000 mL | INTRAMUSCULAR | Status: AC | PRN
  Administered 2021-12-03: 4 mL

## 2021-12-03 MED ORDER — TRIAMCINOLONE ACETONIDE 40 MG/ML IJ SUSP
80.0000 mg | INTRAMUSCULAR | Status: AC | PRN
  Administered 2021-12-03: 80 mg via INTRA_ARTICULAR

## 2021-12-03 NOTE — Progress Notes (Signed)
Chief Complaint: Left hip pain     History of Present Illness:   12/03/2021: Joanna Perkins presents today for her left hip as well as her left shoulder.  She states that she got only transient relief from her left hip injection.  She is also having left shoulder pain which is more painful with overhead type activity with radiation of lateral deltoid.  She denies any weakness or loss of motion.  With regard to the left hip today she is tender predominantly over the SI joint.  Joanna Perkins. Joanna Perkins is a 71 y.o. female presents with multiple months of right knee pain although worse over the course of the last several weeks.  She did hit this on a bed which caused direct pain.  She also had a jerking type injury where her dog dragged her which caused significant pain in the knee.  She enjoys being very active and is currently retired on a farm with multiple acres.  She has 3 [name]'s dogs which are very big that she enjoys walking.  In terms of treatments she has tried ice, activity restriction, over-the-counter Voltaren, anti-inflammatories.  This only provides transient relief.  She has been using an over-the-counter compression sleeve.  Denies any previous surgery.    Surgical History:   None  PMH/PSH/Family History/Social History/Meds/Allergies:    Past Medical History:  Diagnosis Date   Celiac disease 2008   Cyst of pineal gland    Past Surgical History:  Procedure Laterality Date   COLONOSCOPY     TONSILLECTOMY     Social History   Socioeconomic History   Marital status: Married    Spouse name: Joanna Perkins   Number of children: 0   Years of education: college   Highest education level: Doctorate  Occupational History   Occupation: Retired  Tobacco Use   Smoking status: Former    Packs/day: 1.00    Years: 15.00    Pack years: 15.00    Types: Cigarettes    Quit date: 1987    Years since quitting: 36.4   Smokeless tobacco: Never  Vaping Use   Vaping Use:  Never used  Substance and Sexual Activity   Alcohol use: Not Currently   Drug use: No   Sexual activity: Not on file  Other Topics Concern   Not on file  Social History Narrative   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 She recently relocated resides with her husband Joanna Perkins.  No biologic children but she does have one stepchild and 2 grandchildren. She prefers holistic approach to her healthcare.   Right-handed.   Tea each morning, occasional coffee.   Social Determinants of Health   Financial Resource Strain: Not on file  Food Insecurity: Not on file  Transportation Needs: Not on file  Physical Activity: Not on file  Stress: Not on file  Social Connections: Not on file   Family History  Problem Relation Age of Onset   Asthma Mother    Hypertension Mother    COPD Mother    Allergic rhinitis Mother    Hepatitis C Father    Breast cancer Sister    Celiac disease Sister    Thyroid disease Sister    Celiac disease Brother    Heart attack Maternal Grandfather  Heart attack Paternal Grandfather    Celiac disease Sister    Thyroid disease Sister    Colon cancer Neg Hx    Colon polyps Neg Hx    Esophageal cancer Neg Hx    Rectal cancer Neg Hx    Eczema Neg Hx    Urticaria Neg Hx    Allergies  Allergen Reactions   Gluten Meal    Current Outpatient Medications  Medication Sig Dispense Refill   b complex vitamins capsule Take 1 capsule by mouth daily.     Calcium-Magnesium-Vitamin D (CALCIUM 500 PO) Take 1 capsule by mouth daily.     doxycycline (VIBRA-TABS) 100 MG tablet Take 2 tablets after tick bite once. Repeat as needed 30 tablet 1   Iodine, Kelp, (KELP PO) Take 1 capsule by mouth daily.     LORazepam (ATIVAN) 0.5 MG tablet Take 0.5-1 tablets (0.25-0.5 mg total) by mouth at bedtime as needed for anxiety or sedation. 20 tablet 1   TURMERIC PO Take 1 capsule by mouth daily.     VITAMIN D PO Take 1 tablet by mouth daily.      No current facility-administered medications for this visit.   No results found.  Review of Systems:   A ROS was performed including pertinent positives and negatives as documented in the HPI.  Physical Exam :   Constitutional: NAD and appears stated age Neurological: Alert and oriented Psych: Appropriate affect and cooperative There were no vitals taken for this visit.   Comprehensive Musculoskeletal Exam:    Positive Neer impingement with full equal contralateral range of motion of the left shoulder compared to the right.  External rotation is to 70 degrees bilaterally for flexion is to 170 degrees internal rotation is to L1.  Negative belly press 5 out of 5 strength throughout  Inspection Right Left  Skin No atrophy or gross abnormalities appreciated No atrophy or gross abnormalities appreciated  Palpation    Tenderness none SI joint  Crepitus None None  Range of Motion    Flexion (passive) 120 120  Extension 30 30  IR 35 35  ER 45 45  Strength    Flexion  5/5 5/5  Extension 5/5 5/5  Special Tests    FABER Negative Negative  FADIR Negative Negative  ER Lag/Capsular Insufficiency Negative Negative  Instability Negative Negative  Sacroiliac pain Negative  Negative   Instability    Generalized Laxity No No  Neurologic    sciatic, femoral, obturator nerves intact to light sensation  Vascular/Lymphatic    DP pulse 2+ 2+  Lumbar Exam    Patient has symmetric lumbar range of motion with negative pain referral to hip     Imaging:   Xray (AP pelvis, 3 views left hip): There is mild joint space narrowing of the femoral acetabular joint   I personally reviewed and interpreted the radiographs.   Assessment:   71 year old female with left hip pain consistent with hip pain today that is more consistent with pain.  To this effect I would like to plan to refer her to Dr. Ernestina Patches for an SI x-ray guided injection.  She does also have left shoulder pain which is consistent  with ongoing rotator cuff tendinitis.  To this effect I recommended ultrasound-guided subacromial injection.  She would like to proceed with this.  I will plan to see her back in 2 months for reassessment.   Arthritis of the hip joint althoughPlan :    -Left shoulder ultrasound-guided subacromial injection performed  today after verbal consent obtained     Procedure Note  [name]. Sonnier             Date of Birth: Mar 29, 1951           MRN: [id number]             Visit Date: 12/03/2021  Procedures: Visit Diagnoses: No diagnosis found.   Large Joint Inj on 12/03/2021 2:59 PM Indications: pain Details: 22 G 1.5 in needle, ultrasound-guided anterior approach  Arthrogram: No  Medications: 4 mL lidocaine 1 %; 80 mg triamcinolone acetonide 40 MG/ML Outcome: tolerated well, no immediate complications Procedure, treatment alternatives, risks and benefits explained, specific risks discussed. Consent was given by the patient. Immediately prior to procedure a time out was called to verify the correct patient, procedure, equipment, support staff and site/side marked as required. Patient was prepped and draped in the usual sterile fashion.         I personally saw and evaluated the patient, and participated in the management and treatment plan.  [name], MD Attending Physician, Orthopedic Surgery  This document was dictated using Dragon voice recognition software. A reasonable attempt at proof reading has been made to minimize errors.

## 2021-12-05 ENCOUNTER — Encounter (HOSPITAL_BASED_OUTPATIENT_CLINIC_OR_DEPARTMENT_OTHER): Payer: Self-pay | Admitting: Orthopaedic Surgery

## 2021-12-06 ENCOUNTER — Telehealth: Payer: Self-pay | Admitting: Orthopaedic Surgery

## 2021-12-06 ENCOUNTER — Other Ambulatory Visit (HOSPITAL_BASED_OUTPATIENT_CLINIC_OR_DEPARTMENT_OTHER): Payer: Self-pay | Admitting: Orthopaedic Surgery

## 2021-12-06 DIAGNOSIS — M25552 Pain in left hip: Secondary | ICD-10-CM

## 2021-12-06 NOTE — Telephone Encounter (Signed)
Pt called stating Dr. Sammuel Hines sent a referral for pt to have an injection with dr. Ernestina Patches. Do not seer referral on chart. Please call pt about this matter at [phone].

## 2021-12-28 ENCOUNTER — Encounter: Payer: Self-pay | Admitting: Physical Medicine and Rehabilitation

## 2021-12-28 ENCOUNTER — Ambulatory Visit: Payer: Medicare PPO | Admitting: Physical Medicine and Rehabilitation

## 2021-12-28 ENCOUNTER — Ambulatory Visit: Payer: Self-pay

## 2021-12-28 DIAGNOSIS — M461 Sacroiliitis, not elsewhere classified: Secondary | ICD-10-CM

## 2021-12-28 NOTE — Progress Notes (Signed)
Pt state lower back pain that travels to her buttocks and left hip. Pt state walking, standing and bending make sthe pain worse. Pt state she excise and do yoga to help ease her pain.  Numeric Pain Rating Scale and Functional Assessment Average Pain 7   In the last MONTH (on 0-10 scale) has pain interfered with the following?  1. General activity like being  able to carry out your everyday physical activities such as walking, climbing stairs, carrying groceries, or moving a chair?  Rating(10)   +Driver, -BT, -Dye Allergies.

## 2021-12-28 NOTE — Progress Notes (Signed)
Joanna Perkins. [place]ieren[place] - 71 y.o. female MRN [id number]  Date of birth: 1950/09/18  Office Visit Note: Visit Date: 12/28/2021 PCP: [name], DO Referred by: [name], DO  Subjective: Chief Complaint  Patient presents with   Lower Back - Pain   Left Hip - Pain   HPI:  Joanna Perkins. Louissaint is a 71 y.o. female who comes in today at the request of Dr. [name] for planned Left anesthetic Sacroiliac joint arthrogram with fluoroscopic guidance.  The patient has failed conservative care including home exercise, medications, time and activity modification.  This injection will be diagnostic and hopefully therapeutic.  Please see requesting physician notes for further details and justification.   Positive Fortin finger sign, Patrick's testing, Gaenslen's  and lateral compression test.    ROS Otherwise per HPI.  Assessment & Plan: Visit Diagnoses:    ICD-10-CM   1. Sacroiliitis ([place])  M46.1 XR C-ARM NO REPORT      Plan: No additional findings.   Meds & Orders: No orders of the defined types were placed in this encounter.   Orders Placed This Encounter  Procedures   Sacroiliac Joint Inj Left   XR C-ARM NO REPORT    Follow-up: Return for visit to requesting provider as needed.   Procedures: Sacroiliac Joint Inj Left on 12/28/2021 10:38 AM Indications: pain and diagnostic evaluation Details: 22 G 3.5 in needle, fluoroscopy-guided posterior approach Medications: 2 mL bupivacaine 0.5 %; 80 mg methylPREDNISolone acetate 80 MG/ML Outcome: tolerated well, no immediate complications  Sacroiliac Joint Intra-Articular Injection - Posterior Approach with Fluoroscopic Guidance   Position: PRONE  Additional Comments: Vital signs were monitored before and after the procedure. Patient was prepped and draped in the usual sterile fashion. The correct patient, procedure, and site was verified.   Injection Procedure Details:   Location/Site:  Sacroiliac joint  Needle size:  3.5 in Spinal Needle  Needle type: Spinal  Needle Placement: Intra-articular  Findings:  -Comments: There was excellent flow of contrast producing a partial arthrogram of the sacroiliac joint.   Procedure Details: Starting with a 90 degree vertical and midline orientation the fluoroscope was tilted cranially 20 to 25 degrees and the target area of the inferior most part of the SI joint on the side mentioned above was visualized.  The soft tissues overlying this target were infiltrated with 4 ml. of 1% Lidocaine without Epinephrine. A #22 gauge spinal needle was inserted perpendicular to the fluoroscope table and advanced into the posterior inferior joint space using fluoroscopic guidance.  Position in the joint space was confirmed by obtaining a partial arthrogram using a 2 ml. volume of Isovue-250 contrast agent. After negative aspirate for gross pus or blood, the injectate was delivered to the joint. Radiographs were obtained for documentation purposes.   Additional Comments:   Dressing: Bandaid    Post-procedure details: Patient was observed during the procedure. Post-procedure instructions were reviewed.  Patient left the clinic in stable condition.    There was excellent flow of contrast producing a partial arthrogram of the sacroiliac joint.  Procedure, treatment alternatives, risks and benefits explained, specific risks discussed. Consent was given by the patient. Immediately prior to procedure a time out was called to verify the correct patient, procedure, equipment, support staff and site/side marked as required. Patient was prepped and draped in the usual sterile fashion.          Clinical History: No specialty comments available.     Objective:  VS:  HT:  WT:   BMI:     BP:   HR: bpm  TEMP: ( )  RESP:  Physical Exam   Imaging: No results found.

## 2022-01-20 ENCOUNTER — Encounter: Payer: Self-pay | Admitting: Family Medicine

## 2022-01-21 ENCOUNTER — Encounter: Payer: Self-pay | Admitting: Family Medicine

## 2022-01-25 MED ORDER — BUPIVACAINE HCL 0.5 % IJ SOLN
2.0000 mL | INTRAMUSCULAR | Status: AC | PRN
  Administered 2021-12-28: 2 mL via INTRA_ARTICULAR

## 2022-01-25 MED ORDER — METHYLPREDNISOLONE ACETATE 80 MG/ML IJ SUSP
80.0000 mg | INTRAMUSCULAR | Status: AC | PRN
  Administered 2021-12-28: 80 mg via INTRA_ARTICULAR

## 2022-02-02 ENCOUNTER — Encounter: Payer: Self-pay | Admitting: Family Medicine

## 2022-02-02 ENCOUNTER — Ambulatory Visit (HOSPITAL_BASED_OUTPATIENT_CLINIC_OR_DEPARTMENT_OTHER): Payer: Medicare PPO | Admitting: Orthopaedic Surgery

## 2022-02-08 ENCOUNTER — Ambulatory Visit: Payer: Medicare PPO | Admitting: Family Medicine

## 2022-02-08 ENCOUNTER — Encounter: Payer: Self-pay | Admitting: Family Medicine

## 2022-02-08 VITALS — BP 133/79 | HR 94 | Temp 97.3°F | Ht 66.5 in | Wt 135.4 lb

## 2022-02-08 DIAGNOSIS — R413 Other amnesia: Secondary | ICD-10-CM

## 2022-02-08 DIAGNOSIS — T148XXA Other injury of unspecified body region, initial encounter: Secondary | ICD-10-CM | POA: Diagnosis not present

## 2022-02-08 DIAGNOSIS — R233 Spontaneous ecchymoses: Secondary | ICD-10-CM | POA: Diagnosis not present

## 2022-02-08 DIAGNOSIS — Z780 Asymptomatic menopausal state: Secondary | ICD-10-CM | POA: Diagnosis not present

## 2022-02-08 DIAGNOSIS — G479 Sleep disorder, unspecified: Secondary | ICD-10-CM

## 2022-02-08 NOTE — Progress Notes (Signed)
Subjective: CC: Memory problems, bruising, sleep PCP: [name], DO [facility]IDP M. Dockery is a 71 y.o. female presenting to clinic today for:  1.  Sleep issues Patient reports that her sleeping has actually improved with use of passion flower.  However, it did give her some concern that she may have some type of hormonal imbalance given the constellation of skin issues, memory problems and sleep issues.  She is interested in potentially doing some hormone adjustments if she is found to have deficiency.  Has not seen an OB/GYN in many years.  Has not had any abnormal vaginal symptoms to report.  2.  Easy bruising Patient reports that her easy bruising seems to be a little bit more prominent as of late.  Would like to have her labs checked  3.  Memory concern Previously evaluated by Dr. Krista Blue.  Was told that she may have some type of mild cognitive disorder.  She admits that she was somewhat flustered by this initial diagnosis and has not yet returned back to Dr. Krista Blue but after further consideration she thinks that this diagnosis is probably correct and she would like to investigate more.  She has quite a bit of difficulty remembering tasks and this is also noted by her husband, who has become concerned.  She reports poor concentration.   ROS: Per HPI  Allergies  Allergen Reactions   Gluten Meal    Past Medical History:  Diagnosis Date   Celiac disease 2008   Cyst of pineal gland     Current Outpatient Medications:    b complex vitamins capsule, Take 1 capsule by mouth daily., Disp: , Rfl:    doxycycline (VIBRA-TABS) 100 MG tablet, Take 2 tablets after tick bite once. Repeat as needed (Patient not taking: Reported on 02/08/2022), Disp: 30 tablet, Rfl: 1   LORazepam (ATIVAN) 0.5 MG tablet, Take 0.5-1 tablets (0.25-0.5 mg total) by mouth at bedtime as needed for anxiety or sedation., Disp: 20 tablet, Rfl: 1   TURMERIC PO, Take 1 capsule by mouth daily., Disp: , Rfl:    VITAMIN D PO,  Take 1 tablet by mouth daily., Disp: , Rfl:  Social History   Socioeconomic History   Marital status: Married    Spouse name: [name]   Number of children: 0   Years of education: college   Highest education level: Doctorate  Occupational History   Occupation: Retired  Tobacco Use   Smoking status: Former    Packs/day: 1.00    Years: 15.00    Total pack years: 15.00    Types: Cigarettes    Quit date: 1987    Years since quitting: 36.6   Smokeless tobacco: Never  Vaping Use   Vaping Use: Never used  Substance and Sexual Activity   Alcohol use: Not Currently   Drug use: No   Sexual activity: Not on file  Other Topics Concern   Not on file  Social History Narrative   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 She recently relocated resides with her husband [name].  No biologic children but she does have one stepchild and 2 grandchildren. She prefers holistic approach to her healthcare.   Right-handed.   Tea each morning, occasional coffee.   Social Determinants of Health   Financial Resource Strain: Not on file  Food Insecurity: Not on file  Transportation Needs: Not on file  Physical Activity: Not on file  Stress: Not on file  Social Connections: Not on file  Intimate Partner Violence: Not on file   Family History  Problem Relation Age of Onset   Asthma Mother    Hypertension Mother    COPD Mother    Allergic rhinitis Mother    Hepatitis C Father    Breast cancer Sister    Celiac disease Sister    Thyroid disease Sister    Celiac disease Brother    Heart attack Maternal Grandfather    Heart attack Paternal Grandfather    Celiac disease Sister    Thyroid disease Sister    Colon cancer Neg Hx    Colon polyps Neg Hx    Esophageal cancer Neg Hx    Rectal cancer Neg Hx    Eczema Neg Hx    Urticaria Neg Hx     Objective: Office vital signs reviewed. BP 133/79   Pulse 94   Temp (!) 97.3 F (36.3 C)   Ht 5' 6.5"  (1.689 m)   Wt 135 lb 6.4 oz (61.4 kg)   SpO2 100%   BMI 21.53 kg/m   Physical Examination:  General: Awake, alert, well nourished, No acute distress HEENT: Junk TIVA pallor Cardio: regular rate and rhythm, S1S2 heard, no murmurs appreciated Pulm: clear to auscultation bilaterally, no wheezes, rhonchi or rales; normal work of breathing on room air Skin: Multiple areas of ecchymosis and petechiae noted along bilateral upper extremities and 1 along the lower extremity Neuro: see MMSE    02/08/2022   10:19 AM 07/27/2021   10:40 AM  MMSE - Mini Mental State Exam  Orientation to time 5 4  Orientation to Place 5 5  Registration 3 3  Attention/ Calculation 5 5  Recall 0 2  Language- name 2 objects 2 2  Language- repeat 1 1  Language- follow 3 step command 3 3  Language- read & follow direction 1 1  Write a sentence 1 1  Copy design 1 1  Total score 27 28   Assessment/ Plan: 71 y.o. female   Memory change - Plan: Ambulatory referral to Obstetrics / Gynecology  Sleep difficulties - Plan: Ambulatory referral to Obstetrics / Gynecology  Bruising - Plan: CMP14+EGFR, CBC  Postmenopausal estrogen deficiency - Plan: Ambulatory referral to Obstetrics / Gynecology  Minimal change in MMSE score since check 7 months ago but I think that it is reasonable for her to see Dr. Krista Blue again if she has progressive symptoms.  Will CC Dr. Krista Blue on this chart today.  I have given her Dr. Rhea Belton information to contact to follow-up  Sleep difficulties improving with homeopathic remedy.  Check CBC, CMP given petechiae and easy bruising  Referral placed to OB/GYN for consideration of eval for hormonal imbalance.  Patient would like to consider hormone replacement therapy if she is found to have imbalance causing symptomology  No orders of the defined types were placed in this encounter.  No orders of the defined types were placed in this encounter.    [name], DO [facility] [phone]

## 2022-02-08 NOTE — Patient Instructions (Addendum)
Dr [facility]ov[facility] [address], [place], [place] [address] Ph: [phone] Fax: [phone]  I'll get you referred to Ob/GYN to discuss hormones.

## 2022-02-09 LAB — CMP14+EGFR
ALT: 15 IU/L (ref 0–32)
AST: 18 IU/L (ref 0–40)
Albumin/Globulin Ratio: 2 (ref 1.2–2.2)
Albumin: 4.2 g/dL (ref 3.9–4.9)
Alkaline Phosphatase: 57 IU/L (ref 44–121)
BUN/Creatinine Ratio: 14 (ref 12–28)
BUN: 12 mg/dL (ref 8–27)
Bilirubin Total: 0.4 mg/dL (ref 0.0–1.2)
CO2: 24 mmol/L (ref 20–29)
Calcium: 9.5 mg/dL (ref 8.7–10.3)
Chloride: 103 mmol/L (ref 96–106)
Creatinine, Ser: 0.88 mg/dL (ref 0.57–1.00)
Globulin, Total: 2.1 g/dL (ref 1.5–4.5)
Glucose: 90 mg/dL (ref 70–99)
Potassium: 4.2 mmol/L (ref 3.5–5.2)
Sodium: 142 mmol/L (ref 134–144)
Total Protein: 6.3 g/dL (ref 6.0–8.5)
eGFR: 71 mL/min/{1.73_m2} (ref >59)

## 2022-02-09 LAB — CBC
Hematocrit: 40 % (ref 34.0–46.6)
Hemoglobin: 13.4 g/dL (ref 11.1–15.9)
MCH: 31.5 pg (ref 26.6–33.0)
MCHC: 33.5 g/dL (ref 31.5–35.7)
MCV: 94 fL (ref 79–97)
Platelets: 273 10*3/uL (ref 150–450)
RBC: 4.26 x10E6/uL (ref 3.77–5.28)
RDW: 12.1 % (ref 11.7–15.4)
WBC: 6.2 10*3/uL (ref 3.4–10.8)

## 2022-02-11 ENCOUNTER — Encounter: Payer: Self-pay | Admitting: Physical Medicine and Rehabilitation

## 2022-02-11 ENCOUNTER — Encounter: Payer: Self-pay | Admitting: Neurology

## 2022-02-12 ENCOUNTER — Encounter: Payer: Self-pay | Admitting: Family Medicine

## 2022-02-14 ENCOUNTER — Encounter: Payer: Self-pay | Admitting: Family Medicine

## 2022-02-14 NOTE — Telephone Encounter (Signed)
Please route to [facility] or [facility] Sousa

## 2022-02-14 NOTE — Telephone Encounter (Signed)
[employer]

## 2022-02-17 ENCOUNTER — Encounter: Payer: Self-pay | Admitting: Family Medicine

## 2022-02-22 ENCOUNTER — Other Ambulatory Visit: Payer: Self-pay

## 2022-02-22 ENCOUNTER — Encounter: Payer: Self-pay | Admitting: Obstetrics & Gynecology

## 2022-02-23 ENCOUNTER — Ambulatory Visit: Payer: Medicare PPO | Admitting: Obstetrics & Gynecology

## 2022-03-01 ENCOUNTER — Ambulatory Visit: Payer: Medicare PPO | Admitting: Physical Medicine and Rehabilitation

## 2022-03-01 ENCOUNTER — Encounter: Payer: Self-pay | Admitting: Physical Medicine and Rehabilitation

## 2022-03-01 ENCOUNTER — Ambulatory Visit: Payer: Self-pay

## 2022-03-01 VITALS — BP 121/74 | HR 97

## 2022-03-01 DIAGNOSIS — M25552 Pain in left hip: Secondary | ICD-10-CM | POA: Diagnosis not present

## 2022-03-01 DIAGNOSIS — M461 Sacroiliitis, not elsewhere classified: Secondary | ICD-10-CM | POA: Diagnosis not present

## 2022-03-01 NOTE — Progress Notes (Unsigned)
   Joanna Perkins. Chenier - 71 y.o. female MRN [id number]  Date of birth: 01/12/1951  Office Visit Note: Visit Date: 03/01/2022 PCP: [name], DO Referred by: Magnus Sinning, MD  Subjective: Chief Complaint  Patient presents with   Lower Back - Pain   Left Hip - Pain   HPI:  Joanna Perkins. Figiel is a 71 y.o. female who comes in todayHPI ROS Otherwise per HPI.  Assessment & Plan: Visit Diagnoses: No diagnosis found.  Plan: No additional findings.   Meds & Orders: No orders of the defined types were placed in this encounter.  No orders of the defined types were placed in this encounter.   Follow-up: No follow-ups on file.   Procedures: Sacroiliac Joint Inj, Left on 03/01/2022 9:28 AM Indications: pain and diagnostic evaluation Details: 22 G 3.5 in needle, fluoroscopy-guided posterior approach Medications: 2 mL bupivacaine 0.5 %; 40 mg methylPREDNISolone acetate 80 MG/ML Outcome: tolerated well, no immediate complications  Sacroiliac Joint Intra-Articular Injection - Posterior Approach with Fluoroscopic Guidance   Position: PRONE  Additional Comments: Vital signs were monitored before and after the procedure. Patient was prepped and draped in the usual sterile fashion. The correct patient, procedure, and site was verified.   Injection Procedure Details:   Location/Site:  Sacroiliac joint  Needle size: 3.5 in Spinal Needle  Needle type: Spinal  Needle Placement: Intra-articular  Findings:  -Comments: There was excellent flow of contrast producing a partial arthrogram of the sacroiliac joint.   Procedure Details: Starting with a 90 degree vertical and midline orientation the fluoroscope was tilted cranially 20 to 25 degrees and the target area of the inferior most part of the SI joint on the side mentioned above was visualized.  The soft tissues overlying this target were infiltrated with 4 ml. of 1% Lidocaine without Epinephrine. A #22 gauge spinal needle was  inserted perpendicular to the fluoroscope table and advanced into the posterior inferior joint space using fluoroscopic guidance.  Position in the joint space was confirmed by obtaining a partial arthrogram using a 2 ml. volume of Isovue-250 contrast agent. After negative aspirate for gross pus or blood, the injectate was delivered to the joint. Radiographs were obtained for documentation purposes.   Additional Comments:   Dressing: Bandaid    Post-procedure details: Patient was observed during the procedure. Post-procedure instructions were reviewed.  Patient left the clinic in stable condition.    There was excellent flow of contrast producing a partial arthrogram of the sacroiliac joint.  Procedure, treatment alternatives, risks and benefits explained, specific risks discussed. Consent was given by the patient. Immediately prior to procedure a time out was called to verify the correct patient, procedure, equipment, support staff and site/side marked as required. Patient was prepped and draped in the usual sterile fashion.          Clinical History: No specialty comments available.     Objective:  VS:  HT:    WT:   BMI:     BP:121/74  HR:97bpm  TEMP: ( )  RESP:  Physical Exam   Imaging: No results found.

## 2022-03-01 NOTE — Progress Notes (Unsigned)
Pt state lower back pain that travels to her buttocks and left hip. Pt state walking, standing and bending make sthe pain worse. Pt state she excise and do yoga to help ease her pain.  Numeric Pain Rating Scale and Functional Assessment Average Pain 8   In the last MONTH (on 0-10 scale) has pain interfered with the following?  1. General activity like being  able to carry out your everyday physical activities such as walking, climbing stairs, carrying groceries, or moving a chair?  Rating(10)   -BT, -Dye Allergies.

## 2022-03-03 MED ORDER — BUPIVACAINE HCL 0.5 % IJ SOLN
2.0000 mL | INTRAMUSCULAR | Status: AC | PRN
  Administered 2022-03-01: 2 mL via INTRA_ARTICULAR

## 2022-03-03 MED ORDER — METHYLPREDNISOLONE ACETATE 80 MG/ML IJ SUSP
40.0000 mg | INTRAMUSCULAR | Status: AC | PRN
  Administered 2022-03-01: 40 mg via INTRA_ARTICULAR

## 2022-03-08 ENCOUNTER — Encounter (INDEPENDENT_AMBULATORY_CARE_PROVIDER_SITE_OTHER): Payer: Medicare PPO | Admitting: Family Medicine

## 2022-03-08 DIAGNOSIS — W57XXXD Bitten or stung by nonvenomous insect and other nonvenomous arthropods, subsequent encounter: Secondary | ICD-10-CM

## 2022-03-08 MED ORDER — DOXYCYCLINE HYCLATE 100 MG PO TABS: ORAL_TABLET | ORAL | 0 refills | Status: DC

## 2022-03-08 NOTE — Telephone Encounter (Signed)
Please see the MyChart message reply(ies) for my assessment and plan.   Meds ordered this encounter  Medications   doxycycline (VIBRA-TABS) 100 MG tablet    Sig: Take 2 tablets as a single dose after tick bite once for tick that is engorged/ attached for more than 3 days. Repeat as needed  Place on file    Dispense:  20 tablet    Refill:  0      This patient gave consent for this Medical Advice Message and is aware that it may result in a bill to [employer], as well as the possibility of receiving a bill for a co-payment or deductible. They are an established patient, but are not seeking medical advice exclusively about a problem treated during an in person or video visit in the last seven days. I did not recommend an in person or video visit within seven days of my reply.    I spent a total of 5 minutes cumulative time within 7 days through [employer].  Ronnie Doss, DO

## 2022-03-14 ENCOUNTER — Ambulatory Visit: Payer: Medicare PPO | Admitting: Family Medicine

## 2022-04-14 ENCOUNTER — Other Ambulatory Visit: Payer: Self-pay | Admitting: Physical Medicine and Rehabilitation

## 2022-04-14 ENCOUNTER — Telehealth: Payer: Self-pay

## 2022-04-14 ENCOUNTER — Telehealth: Payer: Self-pay | Admitting: Physical Medicine and Rehabilitation

## 2022-04-14 DIAGNOSIS — M5416 Radiculopathy, lumbar region: Secondary | ICD-10-CM

## 2022-04-14 NOTE — Telephone Encounter (Signed)
error 

## 2022-04-14 NOTE — Telephone Encounter (Addendum)
Patient called today requesting to have another injection. I did speak with patient to clarify issues, recent left SI joint injection performed in our office on 03/01/2022 provided relief of pain for approximately 6 weeks. I did explain to patient that we do not perform these injections frequently, roughly about 3-4 per calendar year as we do use steroid medications with these procedures.  Patient would like to have same injection performed that was previously performed by Dr. Edman Circle at [facility], which was left L3 transforaminal ESI. We are willing to try this injection, however if pain relief is short lived we would recommend follow up with Dr. Sammuel Hines or provider of her choice.   We will call patient to schedule injection pending insurance approval.

## 2022-04-19 ENCOUNTER — Encounter: Payer: Self-pay | Admitting: Family Medicine

## 2022-04-21 ENCOUNTER — Ambulatory Visit: Payer: Medicare PPO | Admitting: Neurology

## 2022-04-27 ENCOUNTER — Ambulatory Visit: Payer: Medicare PPO | Admitting: Physical Medicine and Rehabilitation

## 2022-04-27 ENCOUNTER — Ambulatory Visit: Payer: Self-pay

## 2022-04-27 VITALS — BP 118/72 | HR 90

## 2022-04-27 DIAGNOSIS — M5116 Intervertebral disc disorders with radiculopathy, lumbar region: Secondary | ICD-10-CM

## 2022-04-27 DIAGNOSIS — M1612 Unilateral primary osteoarthritis, left hip: Secondary | ICD-10-CM | POA: Diagnosis not present

## 2022-04-27 DIAGNOSIS — M25552 Pain in left hip: Secondary | ICD-10-CM

## 2022-04-27 DIAGNOSIS — M5416 Radiculopathy, lumbar region: Secondary | ICD-10-CM | POA: Diagnosis not present

## 2022-04-27 MED ORDER — DEXAMETHASONE SODIUM PHOSPHATE 10 MG/ML IJ SOLN
10.0000 mg | Freq: Once | INTRAMUSCULAR | Status: AC
  Administered 2022-04-27: 10 mg

## 2022-04-27 NOTE — Patient Instructions (Signed)

## 2022-04-27 NOTE — Progress Notes (Signed)
Numeric Pain Rating Scale and Functional Assessment Average Pain 5   In the last MONTH (on 0-10 scale) has pain interfered with the following?  1. General activity like being  able to carry out your everyday physical activities such as walking, climbing stairs, carrying groceries, or moving a chair?  Rating( 8, varies )   +Driver, -BT, -Dye Allergies.  Worse in the mornings getting out of bed

## 2022-05-04 NOTE — Procedures (Signed)
Lumbosacral Transforaminal Epidural Steroid Injection - Sub-Pedicular Approach with Fluoroscopic Guidance  [name]. Cronce      Date of Birth: Jul 11, 1950 MRN: [id number] PCP: [name], DO      Visit Date: 04/27/2022   Universal Protocol:    Date/Time: 04/27/2022  Consent Given By: the patient  Position: PRONE  Additional Comments: Vital signs were monitored before and after the procedure. Patient was prepped and draped in the usual sterile fashion. The correct patient, procedure, and site was verified.   Injection Procedure Details:   Procedure diagnoses: Lumbar radiculopathy [M54.16]    Meds Administered:  Meds ordered this encounter  Medications   dexamethasone (DECADRON) injection 10 mg    Laterality: Left  Location/Site: L4  Needle:5.0 in., 22 ga.  Short bevel or Quincke spinal needle  Needle Placement: Transforaminal  Findings:    -Comments: Excellent flow of contrast along the nerve, nerve root and into the epidural space.  Patient appears to have a slightly lumbarized S1 segment.  Numbering scheme based on that numbering.  This correlates with MRI findings  Procedure Details: After squaring off the end-plates to get a true AP view, the C-arm was positioned so that an oblique view of the foramen as noted above was visualized. The target area is just inferior to the "nose of the scotty dog" or sub pedicular. The soft tissues overlying this structure were infiltrated with 2-3 ml. of 1% Lidocaine without Epinephrine.  The spinal needle was inserted toward the target using a "trajectory" view along the fluoroscope beam.  Under AP and lateral visualization, the needle was advanced so it did not puncture dura and was located close the 6 O'Clock position of the pedical in AP tracterory. Biplanar projections were used to confirm position. Aspiration was confirmed to be negative for CSF and/or blood. A 1-2 ml. volume of Isovue-250 was injected and flow of  contrast was noted at each level. Radiographs were obtained for documentation purposes.   After attaining the desired flow of contrast documented above, a 0.5 to 1.0 ml test dose of 0.25% Marcaine was injected into each respective transforaminal space.  The patient was observed for 90 seconds post injection.  After no sensory deficits were reported, and normal lower extremity motor function was noted,   the above injectate was administered so that equal amounts of the injectate were placed at each foramen (level) into the transforaminal epidural space.   Additional Comments:  No complications occurred Dressing: 2 x 2 sterile gauze and Band-Aid    Post-procedure details: Patient was observed during the procedure. Post-procedure instructions were reviewed.  Patient left the clinic in stable condition.

## 2022-05-04 NOTE — Progress Notes (Signed)
Joanna Perkins - 71 y.o. female MRN [id number]  Date of birth: Jan 05, 1951  Office Visit Note: Visit Date: 04/27/2022 PCP: [name], DO Referred by: Lorine Bears, NP  Subjective: Chief Complaint  Patient presents with   Lower Back - Pain   HPI:  Joanna Perkins is a 71 y.o. female who comes in today for re-evaluation and management of chronic worsening left hip pain.  Her history can be reviewed with Dr. Eddie Dibbles notes as well as our prior notes.  I originally saw the patient for diagnostic and hopefully therapeutic sacroiliac joint injection with fluoroscopic guidance.  This was performed on 2 occasions with temporary relief of some of the symptoms.  She had seen Dr. Sammuel Hines for her hip pain and he felt like this was probably coming from the hip or the sacroiliac joint.  MRI of the hip did show moderate axial chondral thinning.  She did have an ultrasound-guided injection by Dr. Sammuel Hines.  Prior to seeing Dr. Sammuel Hines however she was treated by Dr. Edman Circle for what he felt was more of a lumbar spine issue.  Lumbar spine MRI was performed after failure of conservative care.  This MRI is reviewed below and does show some narrowing of the lateral recess at L3-4 on the left.  He suggested left L3 transforaminal epidural steroid injection.  The patient was seen by Dr. Franchot Mimes who performed the injection.  Reading through their notes it appears that they actually ended up completing an L4 transforaminal injection because of some confusion on the numbering scheme of the spine.  I went over imaging today with the patient and her husband.  She continues to have left hip pain no groin pain.  Pain with activity somewhat present all the time it does limit her ability to do things comfortably although she stays very active.  She has not had any focal weakness no bowel or bladder changes etc.  She reports more relief with the epidural injection performed by Dr. Franchot Mimes then by the sacroiliac  joint injections.   Review of Systems  Musculoskeletal:  Positive for back pain and joint pain.  All other systems reviewed and are negative.  Otherwise per HPI.  Assessment & Plan: Visit Diagnoses:    ICD-10-CM   1. Lumbar radiculopathy  M54.16 XR C-ARM NO REPORT    Epidural Steroid injection    dexamethasone (DECADRON) injection 10 mg    2. Radiculopathy due to lumbar intervertebral disc disorder  M51.16     3. Pain in left hip  M25.552     4. Unilateral primary osteoarthritis, left hip  M16.12       Plan: Findings:  1.  Low back and left hip and thigh pain seemingly more consistent with potential for radicular pain of the L4 nerve root.  Went over the numbering scheme with her and I feel like she has somewhat of a transitional segment more visible on x-ray than MRI.  This would be more of a lumbarized S1 segment.  Based on that numbering scheme injection performed by Dr. Franchot Mimes earlier this year seem to give her quite a bit of relief.  We decided to repeat that today.  We will follow that.  She does not have any high-grade stenosis and does not seem to be an operative candidate.  Dr. Aneta Mins the orthopedic surgeon she saw felt the same way.  2.  Left hip pain with some early osteoarthritic changes of the left hip.  Pain pattern could  be hip related not classic pattern into the groin however.  This seems less likely to be sacroiliac joint mediated although she did get some diagnostic relief with the sacroiliac joint injection.  She will continue with home exercise.    Meds & Orders:  Meds ordered this encounter  Medications   dexamethasone (DECADRON) injection 10 mg    Orders Placed This Encounter  Procedures   XR C-ARM NO REPORT   Epidural Steroid injection    Follow-up: Return if symptoms worsen or fail to improve.   Procedures: No procedures performed  Lumbosacral Transforaminal Epidural Steroid Injection - Sub-Pedicular Approach with Fluoroscopic Guidance  Patient:  Joanna Perkins      Date of Birth: 1950/10/22 MRN: [id number] PCP: [name], DO      Visit Date: 04/27/2022   Universal Protocol:    Date/Time: 04/27/2022  Consent Given By: the patient  Position: PRONE  Additional Comments: Vital signs were monitored before and after the procedure. Patient was prepped and draped in the usual sterile fashion. The correct patient, procedure, and site was verified.   Injection Procedure Details:   Procedure diagnoses: Lumbar radiculopathy [M54.16]    Meds Administered:  Meds ordered this encounter  Medications   dexamethasone (DECADRON) injection 10 mg    Laterality: Left  Location/Site: L4  Needle:5.0 in., 22 ga.  Short bevel or Quincke spinal needle  Needle Placement: Transforaminal  Findings:    -Comments: Excellent flow of contrast along the nerve, nerve root and into the epidural space.  Patient appears to have a slightly lumbarized S1 segment.  Numbering scheme based on that numbering.  This correlates with MRI findings  Procedure Details: After squaring off the end-plates to get a true AP view, the C-arm was positioned so that an oblique view of the foramen as noted above was visualized. The target area is just inferior to the "nose of the scotty dog" or sub pedicular. The soft tissues overlying this structure were infiltrated with 2-3 ml. of 1% Lidocaine without Epinephrine.  The spinal needle was inserted toward the target using a "trajectory" view along the fluoroscope beam.  Under AP and lateral visualization, the needle was advanced so it did not puncture dura and was located close the 6 O'Clock position of the pedical in AP tracterory. Biplanar projections were used to confirm position. Aspiration was confirmed to be negative for CSF and/or blood. A 1-2 ml. volume of Isovue-250 was injected and flow of contrast was noted at each level. Radiographs were obtained for documentation purposes.   After attaining the desired  flow of contrast documented above, a 0.5 to 1.0 ml test dose of 0.25% Marcaine was injected into each respective transforaminal space.  The patient was observed for 90 seconds post injection.  After no sensory deficits were reported, and normal lower extremity motor function was noted,   the above injectate was administered so that equal amounts of the injectate were placed at each foramen (level) into the transforaminal epidural space.   Additional Comments:  No complications occurred Dressing: 2 x 2 sterile gauze and Band-Aid    Post-procedure details: Patient was observed during the procedure. Post-procedure instructions were reviewed.  Patient left the clinic in stable condition.    Clinical History: Lumbar Spine MRI 2022 IMPRESSION: Mild to moderate subarticular stenosis on the left at L3-4 due to disc degeneration and spurring. No significant spinal stenosis   Mild subarticular stenosis bilaterally L4-5   Mild right foraminal stenosis L5-S1 due to spurring.  Objective:  VS:  HT:    WT:   BMI:     BP:118/72  HR:90bpm  TEMP: ( )  RESP:  Physical Exam Vitals and nursing note reviewed.  Constitutional:      General: She is not in acute distress.    Appearance: Normal appearance. She is well-developed. She is not ill-appearing.  HENT:     Head: Normocephalic and atraumatic.     Right Ear: External ear normal.     Left Ear: External ear normal.     Nose: Nose normal.     Mouth/Throat:     Mouth: Mucous membranes are moist.     Pharynx: Oropharynx is clear.  Eyes:     Extraocular Movements: Extraocular movements intact.     Conjunctiva/sclera: Conjunctivae normal.     Pupils: Pupils are equal, round, and reactive to light.  Cardiovascular:     Rate and Rhythm: Normal rate and regular rhythm.     Pulses: Normal pulses.  Pulmonary:     Effort: Pulmonary effort is normal. No respiratory distress.  Abdominal:     General: There is no distension.     Palpations:  Abdomen is soft.     Tenderness: There is no guarding.  Musculoskeletal:        General: Tenderness present.     Cervical back: Normal range of motion and neck supple.     Right lower leg: No edema.     Left lower leg: No edema.     Comments: Patient has good distal strength with no pain over the greater trochanters.  No clonus or focal weakness.  Some pain and tenderness on the left lower back.  Somewhat of a positive Fortin finger sign and Patrick's testing.  Skin:    General: Skin is warm and dry.     Findings: No erythema, lesion or rash.  Neurological:     General: No focal deficit present.     Mental Status: She is alert and oriented to person, place, and time.     Sensory: No sensory deficit.     Motor: No weakness or abnormal muscle tone.     Coordination: Coordination normal.     Gait: Gait normal.  Psychiatric:        Mood and Affect: Mood normal.        Behavior: Behavior normal.        Thought Content: Thought content normal.      Imaging: No results found.

## 2022-05-05 ENCOUNTER — Encounter: Payer: Self-pay | Admitting: Physical Medicine and Rehabilitation

## 2022-05-08 ENCOUNTER — Encounter: Payer: Self-pay | Admitting: Family Medicine

## 2022-05-09 NOTE — Telephone Encounter (Signed)
Recommend she be checked out in person.  Please offer DOD appt.

## 2022-06-05 ENCOUNTER — Encounter: Payer: Self-pay | Admitting: Family Medicine

## 2022-07-06 ENCOUNTER — Ambulatory Visit: Payer: Medicare PPO | Admitting: Family Medicine

## 2022-07-19 ENCOUNTER — Encounter: Payer: Self-pay | Admitting: Gastroenterology

## 2022-08-18 ENCOUNTER — Telehealth: Payer: Self-pay | Admitting: Physical Medicine and Rehabilitation

## 2022-08-18 NOTE — Telephone Encounter (Signed)
Spoke with patient and she is having the same type of pain as before. Please advise

## 2022-08-18 NOTE — Telephone Encounter (Signed)
Patient called needing to schedule an appointment with Dr. Ernestina Patches for her left hip. The number to contact patient is [phone]

## 2022-08-18 NOTE — Telephone Encounter (Signed)
See previous encounter

## 2022-08-18 NOTE — Telephone Encounter (Signed)
Patient states she has requested several times to get an appointment with Dr. Ernestina Patches and no one has called her back. Please advise

## 2022-08-19 ENCOUNTER — Other Ambulatory Visit: Payer: Self-pay | Admitting: Physical Medicine and Rehabilitation

## 2022-08-19 ENCOUNTER — Telehealth: Payer: Self-pay

## 2022-08-19 DIAGNOSIS — M5416 Radiculopathy, lumbar region: Secondary | ICD-10-CM

## 2022-08-19 NOTE — Telephone Encounter (Signed)
Patient states she is having lower back pain on the left side that is radiating into the left leg to the hip and stops at the knee. She states she is walking different to take pressure off of left leg and it is causing more lower back pain. She has numbness in the inner left thigh. Her pain ranges from a 7-9 depending on the activity. She states her last injection on 04/27/2022 helped tremendously at 80% or more.

## 2022-08-23 ENCOUNTER — Telehealth: Payer: Self-pay | Admitting: Physical Medicine and Rehabilitation

## 2022-08-23 ENCOUNTER — Encounter (INDEPENDENT_AMBULATORY_CARE_PROVIDER_SITE_OTHER): Payer: Medicare PPO | Admitting: Family Medicine

## 2022-08-23 DIAGNOSIS — J320 Chronic maxillary sinusitis: Secondary | ICD-10-CM

## 2022-08-23 DIAGNOSIS — J3489 Other specified disorders of nose and nasal sinuses: Secondary | ICD-10-CM

## 2022-08-23 NOTE — Telephone Encounter (Signed)
Joanna Perkins left detailed voicemail giving her an update on her injection that is still pending

## 2022-08-23 NOTE — Telephone Encounter (Signed)
Patient returned call asked for a call back to schedule an appointment. The number to contact patient is [phone]

## 2022-08-24 MED ORDER — AZELASTINE HCL 0.1 % NA SOLN: 1.0000 | Freq: Two times a day (BID) | NASAL | 12 refills | Status: DC

## 2022-08-24 NOTE — Telephone Encounter (Signed)
Please see the MyChart message reply(ies) for my assessment and plan.    This patient gave consent for this Medical Advice Message and is aware that it may result in a bill to [employer], as well as the possibility of receiving a bill for a co-payment or deductible. They are an established patient, but are not seeking medical advice exclusively about a problem treated during an in person or video visit in the last seven days. I did not recommend an in person or video visit within seven days of my reply.    I spent a total of 7 minutes cumulative time within 7 days through [employer].  Ronnie Doss, DO

## 2022-08-25 ENCOUNTER — Telehealth: Payer: Self-pay | Admitting: Physical Medicine and Rehabilitation

## 2022-09-01 ENCOUNTER — Encounter: Payer: Medicare PPO | Admitting: Physical Medicine and Rehabilitation

## 2022-09-05 ENCOUNTER — Ambulatory Visit: Payer: Self-pay

## 2022-09-05 ENCOUNTER — Ambulatory Visit: Payer: Medicare PPO | Admitting: Physical Medicine and Rehabilitation

## 2022-09-05 VITALS — BP 118/80 | HR 74

## 2022-09-05 DIAGNOSIS — M5416 Radiculopathy, lumbar region: Secondary | ICD-10-CM

## 2022-09-05 MED ORDER — METHYLPREDNISOLONE ACETATE 80 MG/ML IJ SUSP
40.0000 mg | Freq: Once | INTRAMUSCULAR | Status: AC
  Administered 2022-09-05: 40 mg

## 2022-09-05 NOTE — Patient Instructions (Signed)

## 2022-09-05 NOTE — Progress Notes (Unsigned)
Functional Pain Scale - descriptive words and definitions  Distracting (5)    Aware of pain/able to complete some ADL's but limited by pain/sleep is affected and active distractions are only slightly useful. Moderate range order  Average Pain 8   +Driver, -BT, -Dye Allergies.  Left sided lower back and hip pain

## 2022-09-06 NOTE — Procedures (Signed)
Lumbosacral Transforaminal Epidural Steroid Injection - Sub-Pedicular Approach with Fluoroscopic Guidance  [name]      Date of Birth: 1951/03/28 MRN: [id number] PCP: [name], DO      Visit Date: 09/05/2022   Universal Protocol:    Date/Time: 09/05/2022  Consent Given By: the patient  Position: PRONE  Additional Comments: Vital signs were monitored before and after the procedure. Patient was prepped and draped in the usual sterile fashion. The correct patient, procedure, and site was verified.   Injection Procedure Details:   Procedure diagnoses: Lumbar radiculopathy [M54.16]    Meds Administered:  Meds ordered this encounter  Medications   methylPREDNISolone acetate (DEPO-MEDROL) injection 40 mg    Laterality: Left  Location/Site: L4  Needle:5.0 in., 22 ga.  Short bevel or Quincke spinal needle  Needle Placement: Transforaminal  Findings:    -Comments: Excellent flow of contrast along the nerve, nerve root and into the epidural space.  Procedure Details: After squaring off the end-plates to get a true AP view, the C-arm was positioned so that an oblique view of the foramen as noted above was visualized. The target area is just inferior to the "nose of the scotty dog" or sub pedicular. The soft tissues overlying this structure were infiltrated with 2-3 ml. of 1% Lidocaine without Epinephrine.  The spinal needle was inserted toward the target using a "trajectory" view along the fluoroscope beam.  Under AP and lateral visualization, the needle was advanced so it did not puncture dura and was located close the 6 O'Clock position of the pedical in AP tracterory. Biplanar projections were used to confirm position. Aspiration was confirmed to be negative for CSF and/or blood. A 1-2 ml. volume of Isovue-250 was injected and flow of contrast was noted at each level. Radiographs were obtained for documentation purposes.   After attaining the desired  flow of contrast documented above, a 0.5 to 1.0 ml test dose of 0.25% Marcaine was injected into each respective transforaminal space.  The patient was observed for 90 seconds post injection.  After no sensory deficits were reported, and normal lower extremity motor function was noted,   the above injectate was administered so that equal amounts of the injectate were placed at each foramen (level) into the transforaminal epidural space.   Additional Comments:  No complications occurred Dressing: 2 x 2 sterile gauze and Band-Aid    Post-procedure details: Patient was observed during the procedure. Post-procedure instructions were reviewed.  Patient left the clinic in stable condition.

## 2022-09-06 NOTE — Progress Notes (Signed)
Joanna Perkins. Peek - 72 y.o. female MRN [id number]  Date of birth: 16-Dec-1950  Office Visit Note: Visit Date: 09/05/2022 PCP: [name], DO Referred by: [name], MD  Subjective: Chief Complaint  Patient presents with   Lower Back - Pain   HPI:  Joanna Perkins. Joanna Perkins is a 72 y.o. female who comes in today for planned repeat Left L4-5  Lumbar Transforaminal epidural steroid injection with fluoroscopic guidance.  The patient has failed conservative care including home exercise, medications, time and activity modification.  This injection will be diagnostic and hopefully therapeutic.  Please see requesting physician notes for further details and justification. Patient received more than 80% pain relief from prior injection.   Referring: Dr. [name]    ROS Otherwise per HPI.  Assessment & Plan: Visit Diagnoses:    ICD-10-CM   1. Lumbar radiculopathy  M54.16 XR C-ARM NO REPORT    Epidural Steroid injection    methylPREDNISolone acetate (DEPO-MEDROL) injection 40 mg      Plan: No additional findings.   Meds & Orders:  Meds ordered this encounter  Medications   methylPREDNISolone acetate (DEPO-MEDROL) injection 40 mg    Orders Placed This Encounter  Procedures   XR C-ARM NO REPORT   Epidural Steroid injection    Follow-up: Return for visit to requesting provider as needed.   Procedures: No procedures performed  Lumbosacral Transforaminal Epidural Steroid Injection - Sub-Pedicular Approach with Fluoroscopic Guidance  [name]. Steele      Date of Birth: 1951-05-21 MRN: [id number] PCP: [name], DO      Visit Date: 09/05/2022   Universal Protocol:    Date/Time: 09/05/2022  Consent Given By: the patient  Position: PRONE  Additional Comments: Vital signs were monitored before and after the procedure. Patient was prepped and draped in the usual sterile fashion. The correct patient, procedure, and site was verified.   Injection  Procedure Details:   Procedure diagnoses: Lumbar radiculopathy [M54.16]    Meds Administered:  Meds ordered this encounter  Medications   methylPREDNISolone acetate (DEPO-MEDROL) injection 40 mg    Laterality: Left  Location/Site: L4  Needle:5.0 in., 22 ga.  Short bevel or Quincke spinal needle  Needle Placement: Transforaminal  Findings:    -Comments: Excellent flow of contrast along the nerve, nerve root and into the epidural space.  Procedure Details: After squaring off the end-plates to get a true AP view, the C-arm was positioned so that an oblique view of the foramen as noted above was visualized. The target area is just inferior to the "nose of the scotty dog" or sub pedicular. The soft tissues overlying this structure were infiltrated with 2-3 ml. of 1% Lidocaine without Epinephrine.  The spinal needle was inserted toward the target using a "trajectory" view along the fluoroscope beam.  Under AP and lateral visualization, the needle was advanced so it did not puncture dura and was located close the 6 O'Clock position of the pedical in AP tracterory. Biplanar projections were used to confirm position. Aspiration was confirmed to be negative for CSF and/or blood. A 1-2 ml. volume of Isovue-250 was injected and flow of contrast was noted at each level. Radiographs were obtained for documentation purposes.   After attaining the desired flow of contrast documented above, a 0.5 to 1.0 ml test dose of 0.25% Marcaine was injected into each respective transforaminal space.  The patient was observed for 90 seconds post injection.  After no sensory deficits were reported, and normal  lower extremity motor function was noted,   the above injectate was administered so that equal amounts of the injectate were placed at each foramen (level) into the transforaminal epidural space.   Additional Comments:  No complications occurred Dressing: 2 x 2 sterile gauze and Band-Aid    Post-procedure  details: Patient was observed during the procedure. Post-procedure instructions were reviewed.  Patient left the clinic in stable condition.    Clinical History: Lumbar Spine MRI 2022 IMPRESSION: Mild to moderate subarticular stenosis on the left at L3-4 due to disc degeneration and spurring. No significant spinal stenosis   Mild subarticular stenosis bilaterally L4-5   Mild right foraminal stenosis L5-S1 due to spurring.     Objective:  VS:  HT:    WT:   BMI:     BP:118/80  HR:74bpm  TEMP: ( )  RESP:  Physical Exam Vitals and nursing note reviewed.  Constitutional:      General: She is not in acute distress.    Appearance: Normal appearance. She is not ill-appearing.  HENT:     Head: Normocephalic and atraumatic.     Right Ear: External ear normal.     Left Ear: External ear normal.  Eyes:     Extraocular Movements: Extraocular movements intact.  Cardiovascular:     Rate and Rhythm: Normal rate.     Pulses: Normal pulses.  Pulmonary:     Effort: Pulmonary effort is normal. No respiratory distress.  Abdominal:     General: There is no distension.     Palpations: Abdomen is soft.  Musculoskeletal:        General: Tenderness present.     Cervical back: Neck supple.     Right lower leg: No edema.     Left lower leg: No edema.     Comments: Patient has good distal strength with no pain over the greater trochanters.  No clonus or focal weakness.  Skin:    Findings: No erythema, lesion or rash.  Neurological:     General: No focal deficit present.     Mental Status: She is alert and oriented to person, place, and time.     Sensory: No sensory deficit.     Motor: No weakness or abnormal muscle tone.     Coordination: Coordination normal.  Psychiatric:        Mood and Affect: Mood normal.        Behavior: Behavior normal.      Imaging: XR C-ARM NO REPORT  Result Date: 09/06/2022 Please see Notes tab for imaging impression.

## 2022-09-11 ENCOUNTER — Encounter: Payer: Self-pay | Admitting: Family Medicine

## 2022-09-11 DIAGNOSIS — J320 Chronic maxillary sinusitis: Secondary | ICD-10-CM

## 2022-09-12 MED ORDER — AMOXICILLIN-POT CLAVULANATE 875-125 MG PO TABS: 1.0000 | ORAL_TABLET | Freq: Two times a day (BID) | ORAL | 0 refills | Status: DC

## 2022-09-19 DIAGNOSIS — M26622 Arthralgia of left temporomandibular joint: Secondary | ICD-10-CM | POA: Diagnosis not present

## 2022-09-19 DIAGNOSIS — R0982 Postnasal drip: Secondary | ICD-10-CM | POA: Diagnosis not present

## 2022-10-02 ENCOUNTER — Encounter: Payer: Self-pay | Admitting: Family Medicine

## 2022-11-21 DIAGNOSIS — H40033 Anatomical narrow angle, bilateral: Secondary | ICD-10-CM | POA: Diagnosis not present

## 2022-11-21 DIAGNOSIS — H2513 Age-related nuclear cataract, bilateral: Secondary | ICD-10-CM | POA: Diagnosis not present

## 2022-11-30 ENCOUNTER — Encounter: Payer: Self-pay | Admitting: Family Medicine

## 2022-12-07 ENCOUNTER — Telehealth: Payer: Self-pay

## 2022-12-07 ENCOUNTER — Encounter: Payer: Self-pay | Admitting: Physical Medicine and Rehabilitation

## 2022-12-07 ENCOUNTER — Other Ambulatory Visit: Payer: Self-pay

## 2022-12-07 ENCOUNTER — Ambulatory Visit: Payer: Medicare PPO | Admitting: Physical Medicine and Rehabilitation

## 2022-12-07 DIAGNOSIS — M25552 Pain in left hip: Secondary | ICD-10-CM | POA: Diagnosis not present

## 2022-12-07 DIAGNOSIS — M5416 Radiculopathy, lumbar region: Secondary | ICD-10-CM

## 2022-12-07 DIAGNOSIS — M48061 Spinal stenosis, lumbar region without neurogenic claudication: Secondary | ICD-10-CM | POA: Diagnosis not present

## 2022-12-07 NOTE — Patient Instructions (Signed)

## 2022-12-07 NOTE — Progress Notes (Signed)
Functional Pain Scale - descriptive words and definitions  Distracting (5)    Aware of pain/able to complete some ADL's but limited by pain/sleep is affected and active distractions are only slightly useful. Moderate range order  Average Pain 8-9   +Driver, -BT, -Dye Allergies.  Lower back pain on left side that can radiate into left upper leg. She received more than 50% relief on the injection on 09/05/22

## 2022-12-07 NOTE — Telephone Encounter (Signed)
Repeat injection order placed.

## 2022-12-14 ENCOUNTER — Encounter: Payer: Self-pay | Admitting: Physical Medicine and Rehabilitation

## 2022-12-14 DIAGNOSIS — M48061 Spinal stenosis, lumbar region without neurogenic claudication: Secondary | ICD-10-CM

## 2022-12-14 DIAGNOSIS — M5416 Radiculopathy, lumbar region: Secondary | ICD-10-CM | POA: Diagnosis not present

## 2022-12-14 DIAGNOSIS — M25552 Pain in left hip: Secondary | ICD-10-CM

## 2022-12-14 MED ORDER — METHYLPREDNISOLONE ACETATE 80 MG/ML IJ SUSP
80.0000 mg | Freq: Once | INTRAMUSCULAR | Status: AC
  Administered 2022-12-14: 80 mg

## 2022-12-14 NOTE — Progress Notes (Signed)
Joanna Perkins. Hise - 72 y.o. female MRN [id number]  Date of birth: 06-Sep-1950  Office Visit Note: Visit Date: 12/07/2022 PCP: Raliegh Ip, DO Referred by: Raliegh Ip, DO  Subjective: Chief Complaint  Patient presents with   Lower Back - Pain   Left Hip - Pain   HPI: Joanna Perkins. Werkmeister is a 72 y.o. female who comes in today for evaluation and management at the request of Ellin Goodie, FNP and Dr. Huel Cote for chronic recalcitrant with intermittent worsening of low back left hip and thigh pain.  Her notes can be well reviewed as she has had a history of Lyme disease and she has had a history of sacroiliac joint problems and hip problems evaluated by prior orthopedic surgeon at [facility], Dr. Sherrell Puller.  After some diagnostic injections and therapy we did hit on a situation where epidural injection gave her quite a bit of relief.  She is very active with kayaking and hiking etc.  She has not noted any new falls or trauma.  She got more than 50% relief for the last injection which was in March.  She has had more than 3 months of relief with that and the symptoms are starting to return.  She does feel like this is the same problem overall.  She denies any focal weakness.  She has had no red flag complaints of bowel or bladder issues or neurogenic issues.  She continues the home exercise program is very active.      Review of Systems  Musculoskeletal:  Positive for back pain and joint pain.  All other systems reviewed and are negative.  Otherwise per HPI.  Assessment & Plan: Visit Diagnoses:    ICD-10-CM   1. Lumbar radiculopathy  M54.16 XR C-ARM NO REPORT    Epidural Steroid injection    methylPREDNISolone acetate (DEPO-MEDROL) injection 80 mg    2. Pain in left hip  M25.552 Epidural Steroid injection    methylPREDNISolone acetate (DEPO-MEDROL) injection 80 mg    3. Stenosis of lateral recess of lumbar spine  M48.061 Epidural Steroid injection    methylPREDNISolone acetate  (DEPO-MEDROL) injection 80 mg       Plan: Findings:  1.  Chronic and worsening and severe at times low back and left hip and thigh pain consistent with lateral recess narrowing at L3-4 likely affecting the L4 nerve root with good diagnostic relief from L4 transforaminal injection more so than other prior diagnostic injections and therapy.  She continues to be very active and wants to continue to stay active.  This is not an overtly surgical issue and she has had surgical consultation in the past by Dr. Sherrell Puller.  Next approach is to repeat the injection therapeutically and see how she does.  She did get 50% relief or more from the last injection for over 3 months.  She has tried and failed all manner of conservative care with medication management and diagnostic injections and physical therapy.    Meds & Orders:  Meds ordered this encounter  Medications   methylPREDNISolone acetate (DEPO-MEDROL) injection 80 mg    Orders Placed This Encounter  Procedures   XR C-ARM NO REPORT   Epidural Steroid injection    Follow-up: Return for visit to requesting provider as needed.   Procedures: No procedures performed  Lumbosacral Transforaminal Epidural Steroid Injection - Sub-Pedicular Approach with Fluoroscopic Guidance  [name]. Kirsch      Date of Birth: 1951-01-11 MRN: [id number] PCP: Raliegh Ip,  DO      Visit Date: 12/07/2022   Universal Protocol:    Date/Time: 12/07/2022  Consent Given By: the patient  Position: PRONE  Additional Comments: Vital signs were monitored before and after the procedure. Patient was prepped and draped in the usual sterile fashion. The correct patient, procedure, and site was verified.   Injection Procedure Details:   Procedure diagnoses: Lumbar radiculopathy [M54.16]    Meds Administered:  Meds ordered this encounter  Medications   methylPREDNISolone acetate (DEPO-MEDROL) injection 80 mg    Laterality: Left  Location/Site:  L4  Needle:5.0 in., 22 ga.  Short bevel or Quincke spinal needle  Needle Placement: Transforaminal  Findings:    -Comments: Excellent flow of contrast along the nerve, nerve root and into the epidural space.  Procedure Details: After squaring off the end-plates to get a true AP view, the C-arm was positioned so that an oblique view of the foramen as noted above was visualized. The target area is just inferior to the "nose of the scotty dog" or sub pedicular. The soft tissues overlying this structure were infiltrated with 2-3 ml. of 1% Lidocaine without Epinephrine.  The spinal needle was inserted toward the target using a "trajectory" view along the fluoroscope beam.  Under AP and lateral visualization, the needle was advanced so it did not puncture dura and was located close the 6 O'Clock position of the pedical in AP tracterory. Biplanar projections were used to confirm position. Aspiration was confirmed to be negative for CSF and/or blood. A 1-2 ml. volume of Isovue-250 was injected and flow of contrast was noted at each level. Radiographs were obtained for documentation purposes.   After attaining the desired flow of contrast documented above, a 0.5 to 1.0 ml test dose of 0.25% Marcaine was injected into each respective transforaminal space.  The patient was observed for 90 seconds post injection.  After no sensory deficits were reported, and normal lower extremity motor function was noted,   the above injectate was administered so that equal amounts of the injectate were placed at each foramen (level) into the transforaminal epidural space.   Additional Comments:  No complications occurred Dressing: 2 x 2 sterile gauze and Band-Aid    Post-procedure details: Patient was observed during the procedure. Post-procedure instructions were reviewed.  Patient left the clinic in stable condition.    Clinical History: Lumbar Spine MRI 2022 IMPRESSION: Mild to moderate subarticular  stenosis on the left at L3-4 due to disc degeneration and spurring. No significant spinal stenosis   Mild subarticular stenosis bilaterally L4-5   Mild right foraminal stenosis L5-S1 due to spurring.   She reports that she quit smoking about 37 years ago. Her smoking use included cigarettes. She has a 15.00 pack-year smoking history. She has never used smokeless tobacco. No results for input(s): "HGBA1C", "LABURIC" in the last 8760 hours.  Objective:  VS:  HT:    WT:   BMI:     BP:   HR: bpm  TEMP: ( )  RESP:  Physical Exam Vitals and nursing note reviewed.  Constitutional:      General: She is not in acute distress.    Appearance: Normal appearance. She is well-developed. She is not ill-appearing.  HENT:     Head: Normocephalic and atraumatic.     Right Ear: External ear normal.     Left Ear: External ear normal.  Eyes:     Extraocular Movements: Extraocular movements intact.     Conjunctiva/sclera: Conjunctivae normal.  Pupils: Pupils are equal, round, and reactive to light.  Cardiovascular:     Rate and Rhythm: Normal rate.     Pulses: Normal pulses.  Pulmonary:     Effort: Pulmonary effort is normal. No respiratory distress.  Abdominal:     General: There is no distension.     Palpations: Abdomen is soft.  Musculoskeletal:        General: Tenderness present.     Cervical back: Neck supple.     Right lower leg: No edema.     Left lower leg: No edema.     Comments: Patient has good distal strength with no pain over the greater trochanters.  No clonus or focal weakness.  Some dysesthesia at L4 dermatome  Skin:    General: Skin is warm and dry.     Findings: No erythema, lesion or rash.  Neurological:     General: No focal deficit present.     Mental Status: She is alert and oriented to person, place, and time.     Sensory: No sensory deficit.     Motor: No weakness or abnormal muscle tone.     Coordination: Coordination normal.     Gait: Gait normal.   Psychiatric:        Mood and Affect: Mood normal.        Behavior: Behavior normal.     Ortho Exam  Imaging: No results found.  Past Medical/Family/Surgical/Social History: Medications & Allergies reviewed per EMR, new medications updated. Patient Active Problem List   Diagnosis Date Noted   Brain cyst 09/03/2021   Adverse reaction to food, subsequent encounter 09/10/2020   Tick bite of neck 07/06/2020   Osteopenia 05/01/2017   Celiac disease 04/18/2017   Lyme disease 04/18/2017   History of squamous cell carcinoma 04/18/2017   Post-menopausal 04/18/2017   Past Medical History:  Diagnosis Date   Celiac disease 2008   Cyst of pineal gland    Family History  Problem Relation Age of Onset   Asthma Mother    Hypertension Mother    COPD Mother    Allergic rhinitis Mother    Hepatitis C Father    Breast cancer Sister    Celiac disease Sister    Thyroid disease Sister    Celiac disease Brother    Heart attack Maternal Grandfather    Heart attack Paternal Grandfather    Celiac disease Sister    Thyroid disease Sister    Colon cancer Neg Hx    Colon polyps Neg Hx    Esophageal cancer Neg Hx    Rectal cancer Neg Hx    Eczema Neg Hx    Urticaria Neg Hx    Past Surgical History:  Procedure Laterality Date   COLONOSCOPY     TONSILLECTOMY     Social History   Occupational History   Occupation: Retired  Tobacco Use   Smoking status: Former    Packs/day: 1.00    Years: 15.00    Additional pack years: 0.00    Total pack years: 15.00    Types: Cigarettes    Quit date: 1987    Years since quitting: 37.4   Smokeless tobacco: Never  Vaping Use   Vaping Use: Never used  Substance and Sexual Activity   Alcohol use: Not Currently   Drug use: No   Sexual activity: Not on file

## 2022-12-14 NOTE — Procedures (Signed)
Lumbosacral Transforaminal Epidural Steroid Injection - Sub-Pedicular Approach with Fluoroscopic Guidance  [name]. Furnari      Date of Birth: 04-30-1951 MRN: [id number] PCP: Raliegh Ip, DO      Visit Date: 12/07/2022   Universal Protocol:    Date/Time: 12/07/2022  Consent Given By: the patient  Position: PRONE  Additional Comments: Vital signs were monitored before and after the procedure. Patient was prepped and draped in the usual sterile fashion. The correct patient, procedure, and site was verified.   Injection Procedure Details:   Procedure diagnoses: Lumbar radiculopathy [M54.16]    Meds Administered:  Meds ordered this encounter  Medications   methylPREDNISolone acetate (DEPO-MEDROL) injection 80 mg    Laterality: Left  Location/Site: L4  Needle:5.0 in., 22 ga.  Short bevel or Quincke spinal needle  Needle Placement: Transforaminal  Findings:    -Comments: Excellent flow of contrast along the nerve, nerve root and into the epidural space.  Procedure Details: After squaring off the end-plates to get a true AP view, the C-arm was positioned so that an oblique view of the foramen as noted above was visualized. The target area is just inferior to the "nose of the scotty dog" or sub pedicular. The soft tissues overlying this structure were infiltrated with 2-3 ml. of 1% Lidocaine without Epinephrine.  The spinal needle was inserted toward the target using a "trajectory" view along the fluoroscope beam.  Under AP and lateral visualization, the needle was advanced so it did not puncture dura and was located close the 6 O'Clock position of the pedical in AP tracterory. Biplanar projections were used to confirm position. Aspiration was confirmed to be negative for CSF and/or blood. A 1-2 ml. volume of Isovue-250 was injected and flow of contrast was noted at each level. Radiographs were obtained for documentation purposes.   After attaining the desired  flow of contrast documented above, a 0.5 to 1.0 ml test dose of 0.25% Marcaine was injected into each respective transforaminal space.  The patient was observed for 90 seconds post injection.  After no sensory deficits were reported, and normal lower extremity motor function was noted,   the above injectate was administered so that equal amounts of the injectate were placed at each foramen (level) into the transforaminal epidural space.   Additional Comments:  No complications occurred Dressing: 2 x 2 sterile gauze and Band-Aid    Post-procedure details: Patient was observed during the procedure. Post-procedure instructions were reviewed.  Patient left the clinic in stable condition.

## 2023-02-15 ENCOUNTER — Other Ambulatory Visit: Payer: Self-pay

## 2023-02-15 DIAGNOSIS — Z1231 Encounter for screening mammogram for malignant neoplasm of breast: Secondary | ICD-10-CM

## 2023-04-09 ENCOUNTER — Encounter (INDEPENDENT_AMBULATORY_CARE_PROVIDER_SITE_OTHER): Payer: Medicare PPO | Admitting: Family Medicine

## 2023-04-09 DIAGNOSIS — R3989 Other symptoms and signs involving the genitourinary system: Secondary | ICD-10-CM

## 2023-04-10 MED ORDER — AMOXICILLIN-POT CLAVULANATE 875-125 MG PO TABS: 1.0000 | ORAL_TABLET | Freq: Two times a day (BID) | ORAL | 0 refills | Status: DC

## 2023-04-10 NOTE — Telephone Encounter (Signed)

## 2023-04-12 ENCOUNTER — Encounter (INDEPENDENT_AMBULATORY_CARE_PROVIDER_SITE_OTHER): Payer: Medicare PPO | Admitting: Family Medicine

## 2023-04-12 DIAGNOSIS — W57XXXA Bitten or stung by nonvenomous insect and other nonvenomous arthropods, initial encounter: Secondary | ICD-10-CM | POA: Diagnosis not present

## 2023-04-12 DIAGNOSIS — S40862A Insect bite (nonvenomous) of left upper arm, initial encounter: Secondary | ICD-10-CM | POA: Diagnosis not present

## 2023-04-12 MED ORDER — DOXYCYCLINE HYCLATE 100 MG PO TABS
ORAL_TABLET | ORAL | 0 refills | Status: DC
Start: 2023-04-12 — End: 2023-10-29

## 2023-04-12 NOTE — Telephone Encounter (Signed)

## 2023-08-31 ENCOUNTER — Other Ambulatory Visit: Payer: Self-pay | Admitting: Family Medicine

## 2023-08-31 ENCOUNTER — Encounter (INDEPENDENT_AMBULATORY_CARE_PROVIDER_SITE_OTHER): Payer: Self-pay | Admitting: Family Medicine

## 2023-08-31 DIAGNOSIS — S40862A Insect bite (nonvenomous) of left upper arm, initial encounter: Secondary | ICD-10-CM

## 2023-08-31 DIAGNOSIS — B009 Herpesviral infection, unspecified: Secondary | ICD-10-CM

## 2023-09-01 MED ORDER — VALACYCLOVIR HCL 1 G PO TABS
1000.0000 mg | ORAL_TABLET | Freq: Two times a day (BID) | ORAL | 0 refills | Status: AC
Start: 2023-09-01 — End: 2023-09-06

## 2023-09-01 NOTE — Telephone Encounter (Signed)

## 2023-10-18 ENCOUNTER — Telehealth: Payer: Self-pay | Admitting: Physical Medicine and Rehabilitation

## 2023-10-18 ENCOUNTER — Other Ambulatory Visit: Payer: Self-pay | Admitting: Physical Medicine and Rehabilitation

## 2023-10-18 ENCOUNTER — Telehealth: Payer: Self-pay

## 2023-10-18 DIAGNOSIS — M5416 Radiculopathy, lumbar region: Secondary | ICD-10-CM

## 2023-10-18 NOTE — Progress Notes (Signed)
 Left L4 transforaminal epidural steroid injection performed on 12/07/2022 provided greater than 80% relief of pain for 6 or more months. I am placing order for repeat injection.

## 2023-10-18 NOTE — Telephone Encounter (Signed)
 Pt called the triage line and advised that she would like to make an appt with FN and was returning a call cb#[phone]

## 2023-10-18 NOTE — Telephone Encounter (Signed)
 Pt's husband called requesting an appt for injection for hip injection. Please call Cornel Diesel at [phone].

## 2023-10-25 NOTE — Progress Notes (Unsigned)
 Joanna Perkins is a 73 y.o. female presents to office today for annual physical exam examination.    Concerns today include: 1. ***  Occupation: ***, Marital status: ***, Substance use: *** Health Maintenance Due  Topic Date Due   Medicare Annual Wellness (AWV)  Never done   Colonoscopy  07/20/2022   COVID-19 Vaccine (7 - 2024-25 season) 03/05/2023   MAMMOGRAM  07/01/2023   Refills needed today: ***  Immunization History  Administered Date(s) Administered   Fluad Quad(high Dose 65+) 05/15/2019, 03/31/2020, 03/29/2021   Fluad Trivalent(High Dose 65+) 06/07/2023   Influenza, High Dose Seasonal PF 05/01/2017, 05/02/2018   Moderna Sars-Covid-2 Vaccination 08-19-1950, 09/02/2019, 06/09/2020, 02/02/2021, 10/19/2021   PNEUMOCOCCAL CONJUGATE-20 03/29/2021   Pfizer(Comirnaty)Fall Seasonal Vaccine 12 years and older 05/03/2022   Pneumococcal Conjugate-13 09/12/2018   Td 09/12/2018   Tdap 09/12/2018   Zoster Recombinant(Shingrix) 07/10/2014, 07/06/2018   Past Medical History:  Diagnosis Date   Celiac disease 2008   Cyst of pineal gland    Social History   Socioeconomic History   Marital status: Married    Spouse name: Cornel Diesel   Number of children: 0   Years of education: college   Highest education level: Doctorate  Occupational History   Occupation: Retired  Tobacco Use   Smoking status: Former    Current packs/day: 0.00    Average packs/day: 1 pack/day for 15.0 years (15.0 ttl pk-yrs)    Types: Cigarettes    Start date: 80    Quit date: 1987    Years since quitting: 38.3   Smokeless tobacco: Never  Vaping Use   Vaping status: Never Used  Substance and Sexual Activity   Alcohol use: Not Currently   Drug use: No   Sexual activity: Not on file  Other Topics Concern   Not on file  Social History Narrative   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 She recently relocated resides with her husband Cornel Diesel.  No biologic  children but she does have one stepchild and 2 grandchildren. She prefers holistic approach to her healthcare.   Right-handed.   Tea each morning, occasional coffee.   Social Drivers of Corporate investment banker Strain: Low Risk  (10/24/2023)   Overall Financial Resource Strain (CARDIA)    Difficulty of Paying Living Expenses: Not hard at all  Food Insecurity: No Food Insecurity (10/24/2023)   Hunger Vital Sign    Worried About Running Out of Food in the Last Year: Never true    Ran Out of Food in the Last Year: Never true  Transportation Needs: No Transportation Needs (10/24/2023)   PRAPARE - Administrator, Civil Service (Medical): No    Lack of Transportation (Non-Medical): No  Physical Activity: Sufficiently Active (10/24/2023)   Exercise Vital Sign    Days of Exercise per Week: 7 days    Minutes of Exercise per Session: 150+ min  Stress: Stress Concern Present (10/24/2023)   [employer] of Occupational Health - Occupational Stress Questionnaire    Feeling of Stress : To some extent  Social Connections: Moderately Isolated (10/24/2023)   Social Connection and Isolation Panel [NHANES]    Frequency of Communication with Friends and Family: Twice a week    Frequency of Social Gatherings with Friends and Family: Once a week    Attends Religious Services: Never    Database administrator or Organizations: No    Attends Banker  Meetings: Not on file    Marital Status: Married  Catering manager Violence: Not on file   Past Surgical History:  Procedure Laterality Date   COLONOSCOPY     TONSILLECTOMY     Family History  Problem Relation Age of Onset   Asthma Mother    Hypertension Mother    COPD Mother    Allergic rhinitis Mother    Hepatitis C Father    Breast cancer Sister    Celiac disease Sister    Thyroid  disease Sister    Celiac disease Brother    Heart attack Maternal Grandfather    Heart attack Paternal Grandfather    Celiac disease  Sister    Thyroid  disease Sister    Colon cancer Neg Hx    Colon polyps Neg Hx    Esophageal cancer Neg Hx    Rectal cancer Neg Hx    Eczema Neg Hx    Urticaria Neg Hx     Current Outpatient Medications:    amoxicillin -clavulanate (AUGMENTIN ) 875-125 MG tablet, Take 1 tablet by mouth 2 (two) times daily., Disp: 20 tablet, Rfl: 0   azelastine  (ASTELIN ) 0.1 % nasal spray, Place 1 spray into both nostrils 2 (two) times daily., Disp: 30 mL, Rfl: 12   b complex vitamins capsule, Take 1 capsule by mouth daily., Disp: , Rfl:    CHELATED MAGNESIUM PO, Take 400 mg by mouth., Disp: , Rfl:    doxycycline  (VIBRA -TABS) 100 MG tablet, Take 2 tablets as a single dose after tick bite once for tick that is engorged/ attached for more than 3 days. Repeat as needed  Place on file, Disp: 20 tablet, Rfl: 0   Ergocalciferol  (VITAMIN D2) 10 MCG (400 UNIT) TABS, Take by mouth., Disp: , Rfl:    Ginkgo Biloba (GINKGO PO), Take 120 mg by mouth., Disp: , Rfl:    Iodine, Kelp, (KELP PO), Take 600 mg by mouth., Disp: , Rfl:    LORazepam  (ATIVAN ) 0.5 MG tablet, Take 0.25-0.5 mg by mouth at bedtime as needed., Disp: , Rfl:    Misc Natural Products (GINSENG COMPLEX PO), Take 400 mg by mouth., Disp: , Rfl:    Misc Natural Products (RED CLOVER COMBINATION PO), Take 800 mg by mouth., Disp: , Rfl:    Multiple Vitamins-Minerals (ZINC PO), Take 22.5 mg by mouth., Disp: , Rfl:    TURMERIC PO, Take 1 capsule by mouth daily., Disp: , Rfl:    UNABLE TO FIND, Med Name: Passion Flower Tincture ~60mL, Disp: , Rfl:    UNABLE TO FIND, 360 mg. Med Name: Rax B , Disp: , Rfl:    UNABLE TO FIND, 750 mg. Med Name: Curamed, Disp: , Rfl:    VITAMIN D  PO, Take 25 mcg by mouth daily., Disp: , Rfl:    VITAMIN E PO, Take 670 mg by mouth., Disp: , Rfl:   Allergies  Allergen Reactions   Gluten Meal      ROS: Review of Systems {ros; complete:30496}    Physical exam {Exam, Complete:[phone]}      02/08/2022   10:16 AM 10/25/2021     1:07 PM 08/24/2021    8:19 AM  Depression screen PHQ 2/9  Decreased Interest 0 0 0  Down, Depressed, Hopeless 0 0 0  PHQ - 2 Score 0 0 0  Altered sleeping 2 2 0  Tired, decreased energy 0 1 0  Change in appetite 0 0 0  Feeling bad or failure about yourself  0 0 0  Trouble  concentrating 2 0 0  Moving slowly or fidgety/restless 0 0 0  Suicidal thoughts 0 0 0  PHQ-9 Score 4 3 0  Difficult doing work/chores Not difficult at all Not difficult at all Not difficult at all      02/08/2022   10:16 AM 10/25/2021    1:08 PM 08/24/2021    8:19 AM 07/27/2021   10:40 AM  GAD 7 : Generalized Anxiety Score  Nervous, Anxious, on Edge 1 0 1 3  Control/stop worrying 0 1 0 3  Worry too much - different things 0 1 0 3  Trouble relaxing 0 0 0 2  Restless 0 0 0 2  Easily annoyed or irritable 0 1 0 3  Afraid - awful might happen 0 1 0 3  Total GAD 7 Score 1 4 1 19   Anxiety Difficulty Not difficult at all Somewhat difficult Not difficult at all Somewhat difficult     Assessment/ Plan: Joanna Perkins here for annual physical exam.   Annual physical exam  Memory change  ***  Counseled on healthy lifestyle choices, including diet (rich in fruits, vegetables and lean meats and low in salt and simple carbohydrates) and exercise (at least 30 minutes of moderate physical activity daily).  Patient to follow up ***  Joanna Perkins Butcher, DO

## 2023-10-27 ENCOUNTER — Ambulatory Visit: Admitting: Family Medicine

## 2023-10-27 ENCOUNTER — Encounter: Payer: Self-pay | Admitting: Family Medicine

## 2023-10-27 VITALS — BP 120/68 | HR 79 | Temp 97.9°F | Ht 66.0 in | Wt 134.0 lb

## 2023-10-27 DIAGNOSIS — R413 Other amnesia: Secondary | ICD-10-CM

## 2023-10-27 DIAGNOSIS — R41840 Attention and concentration deficit: Secondary | ICD-10-CM | POA: Diagnosis not present

## 2023-10-27 DIAGNOSIS — Z0001 Encounter for general adult medical examination with abnormal findings: Secondary | ICD-10-CM

## 2023-10-27 DIAGNOSIS — Z130 Encounter for screening for diseases of the blood and blood-forming organs and certain disorders involving the immune mechanism: Secondary | ICD-10-CM | POA: Diagnosis not present

## 2023-10-27 DIAGNOSIS — Z0000 Encounter for general adult medical examination without abnormal findings: Secondary | ICD-10-CM

## 2023-10-27 DIAGNOSIS — E7800 Pure hypercholesterolemia, unspecified: Secondary | ICD-10-CM | POA: Diagnosis not present

## 2023-10-28 LAB — LIPID PANEL
Chol/HDL Ratio: 2.2 ratio (ref 0.0–4.4)
Cholesterol, Total: 173 mg/dL (ref 100–199)
HDL: 80 mg/dL (ref >39)
LDL Chol Calc (NIH): 80 mg/dL (ref 0–99)
Triglycerides: 69 mg/dL (ref 0–149)
VLDL Cholesterol Cal: 13 mg/dL (ref 5–40)

## 2023-10-28 LAB — CBC
Hematocrit: 39.5 % (ref 34.0–46.6)
Hemoglobin: 13.1 g/dL (ref 11.1–15.9)
MCH: 30.8 pg (ref 26.6–33.0)
MCHC: 33.2 g/dL (ref 31.5–35.7)
MCV: 93 fL (ref 79–97)
Platelets: 229 10*3/uL (ref 150–450)
RBC: 4.25 x10E6/uL (ref 3.77–5.28)
RDW: 12.7 % (ref 11.7–15.4)
WBC: 5.4 10*3/uL (ref 3.4–10.8)

## 2023-10-28 LAB — CMP14+EGFR
ALT: 14 IU/L (ref 0–32)
AST: 17 IU/L (ref 0–40)
Albumin: 4.3 g/dL (ref 3.8–4.8)
Alkaline Phosphatase: 61 IU/L (ref 44–121)
BUN/Creatinine Ratio: 22 (ref 12–28)
BUN: 20 mg/dL (ref 8–27)
Bilirubin Total: 0.3 mg/dL (ref 0.0–1.2)
CO2: 24 mmol/L (ref 20–29)
Calcium: 9.5 mg/dL (ref 8.7–10.3)
Chloride: 101 mmol/L (ref 96–106)
Creatinine, Ser: 0.89 mg/dL (ref 0.57–1.00)
Globulin, Total: 2.2 g/dL (ref 1.5–4.5)
Glucose: 74 mg/dL (ref 70–99)
Potassium: 4.2 mmol/L (ref 3.5–5.2)
Sodium: 140 mmol/L (ref 134–144)
Total Protein: 6.5 g/dL (ref 6.0–8.5)
eGFR: 69 mL/min/{1.73_m2} (ref >59)

## 2023-10-28 LAB — TSH: TSH: 1.79 u[IU]/mL (ref 0.450–4.500)

## 2023-10-29 ENCOUNTER — Other Ambulatory Visit: Payer: Self-pay | Admitting: Family Medicine

## 2023-10-29 DIAGNOSIS — S40862A Insect bite (nonvenomous) of left upper arm, initial encounter: Secondary | ICD-10-CM

## 2023-10-30 ENCOUNTER — Encounter (INDEPENDENT_AMBULATORY_CARE_PROVIDER_SITE_OTHER): Payer: Self-pay | Admitting: Family Medicine

## 2023-10-30 DIAGNOSIS — S30860A Insect bite (nonvenomous) of lower back and pelvis, initial encounter: Secondary | ICD-10-CM | POA: Diagnosis not present

## 2023-10-30 DIAGNOSIS — W57XXXA Bitten or stung by nonvenomous insect and other nonvenomous arthropods, initial encounter: Secondary | ICD-10-CM

## 2023-10-30 MED ORDER — DOXYCYCLINE HYCLATE 100 MG PO TABS
ORAL_TABLET | ORAL | 0 refills | Status: DC
Start: 2023-10-30 — End: 2023-11-21

## 2023-11-05 ENCOUNTER — Encounter: Payer: Self-pay | Admitting: Family Medicine

## 2023-11-14 ENCOUNTER — Encounter: Payer: Self-pay | Admitting: Family Medicine

## 2023-11-15 ENCOUNTER — Ambulatory Visit: Admitting: Physical Medicine and Rehabilitation

## 2023-11-15 ENCOUNTER — Other Ambulatory Visit: Payer: Self-pay

## 2023-11-15 VITALS — BP 122/83 | HR 64

## 2023-11-15 DIAGNOSIS — G8929 Other chronic pain: Secondary | ICD-10-CM

## 2023-11-15 DIAGNOSIS — M5416 Radiculopathy, lumbar region: Secondary | ICD-10-CM

## 2023-11-15 DIAGNOSIS — M5116 Intervertebral disc disorders with radiculopathy, lumbar region: Secondary | ICD-10-CM | POA: Diagnosis not present

## 2023-11-15 DIAGNOSIS — M48061 Spinal stenosis, lumbar region without neurogenic claudication: Secondary | ICD-10-CM | POA: Diagnosis not present

## 2023-11-15 MED ORDER — METHYLPREDNISOLONE ACETATE 40 MG/ML IJ SUSP
40.0000 mg | Freq: Once | INTRAMUSCULAR | Status: AC
  Administered 2023-11-15: 40 mg

## 2023-11-15 NOTE — Progress Notes (Signed)
 Pain Scale   Average Pain 8 Patient advising her lower back pan radiates into her left hip area without relief.         +Driver, -BT, -Dye Allergies.

## 2023-11-15 NOTE — Patient Instructions (Signed)

## 2023-11-21 MED ORDER — DOXYCYCLINE HYCLATE 100 MG PO TABS: ORAL_TABLET | ORAL | 0 refills | Status: AC

## 2023-11-21 NOTE — Telephone Encounter (Signed)

## 2023-12-04 ENCOUNTER — Telehealth: Payer: Self-pay | Admitting: Neurology

## 2023-12-04 ENCOUNTER — Encounter: Payer: Self-pay | Admitting: Physical Medicine and Rehabilitation

## 2023-12-04 NOTE — Telephone Encounter (Signed)
Request for an appointment

## 2023-12-04 NOTE — Procedures (Signed)
 Lumbosacral Transforaminal Epidural Steroid Injection - Sub-Pedicular Approach with Fluoroscopic Guidance  [name]      Date of Birth: Jul 18, 1950 MRN: [id number] PCP: Eliodoro Guerin, DO      Visit Date: 11/15/2023   Universal Protocol:    Date/Time: 11/15/2023  Consent Given By: the patient  Position: PRONE  Additional Comments: Vital signs were monitored before and after the procedure. Patient was prepped and draped in the usual sterile fashion. The correct patient, procedure, and site was verified.   Injection Procedure Details:   Procedure diagnoses: Lumbar radiculopathy [M54.16]    Meds Administered:  Meds ordered this encounter  Medications   methylPREDNISolone  acetate (DEPO-MEDROL ) injection 40 mg    Laterality: Left  Location/Site: L4  Needle:5.0 in., 22 ga.  Short bevel or Quincke spinal needle  Needle Placement: Transforaminal  Findings:    -Comments: Excellent flow of contrast along the nerve, nerve root and into the epidural space.  Procedure Details: After squaring off the end-plates to get a true AP view, the C-arm was positioned so that an oblique view of the foramen as noted above was visualized. The target area is just inferior to the "nose of the scotty dog" or sub pedicular. The soft tissues overlying this structure were infiltrated with 2-3 ml. of 1% Lidocaine  without Epinephrine.  The spinal needle was inserted toward the target using a "trajectory" view along the fluoroscope beam.  Under AP and lateral visualization, the needle was advanced so it did not puncture dura and was located close the 6 O'Clock position of the pedical in AP tracterory. Biplanar projections were used to confirm position. Aspiration was confirmed to be negative for CSF and/or blood. A 1-2 ml. volume of Isovue-250 was injected and flow of contrast was noted at each level. Radiographs were obtained for documentation purposes.   After attaining the desired  flow of contrast documented above, a 0.5 to 1.0 ml test dose of 0.25% Marcaine  was injected into each respective transforaminal space.  The patient was observed for 90 seconds post injection.  After no sensory deficits were reported, and normal lower extremity motor function was noted,   the above injectate was administered so that equal amounts of the injectate were placed at each foramen (level) into the transforaminal epidural space.   Additional Comments:  The patient tolerated the procedure well Dressing: 2 x 2 sterile gauze and Band-Aid    Post-procedure details: Patient was observed during the procedure. Post-procedure instructions were reviewed.  Patient left the clinic in stable condition.

## 2023-12-04 NOTE — Progress Notes (Signed)
 Joanna Perkins. Danner - 73 y.o. female MRN [id number]  Date of birth: [birth year]  Office Visit Note: Visit Date: 11/15/2023 PCP: Eliodoro Guerin, DO Referred by: Eliodoro Guerin, DO  Subjective: Chief Complaint  Patient presents with   Lower Back - Pain   HPI: Chesnie Capell. Mooers is a 73 y.o. female who comes in today for evaluation and management at the request of Elvan Hamel, FNP for chronic and worsening and severe left low back and left hip pain.  Her history is well-documented in the charts.  She originally saw Dr. Wilhelmenia Harada in 2023.  Subsequently had multiple injections from a diagnostic standpoint as well as evaluation by spine surgeon.  Ultimately shown on that small disc protrusion with lateral recess narrowing seem to fit with her symptoms and epidural injection from a transforaminal approach at L4 seem to help quite a bit.  The last time we saw her for that injection was almost a year ago in 2024.  She reports doing quite well recently but just fairly recently started to get increasing symptoms in really wanting to try to see if she can get some help with that.  She has a history of multiple tick bites with history of possible Lyme disease.  She also has celiac disease.  She is very active with hiking and kayaking etc.  She reports that she has done really quite well since the last injection up until recently.  No specific injury.  No right-sided complaints.  Nothing down the legs or paresthesias per se.  No focal weakness.  Again very active overall.  No fevers chills or night sweats.  No unintended weight loss.   I spent more than 30 minutes speaking face-to-face with the patient with 50% of the time in counseling and discussing coordination of care.       Review of Systems  Musculoskeletal:  Positive for back pain and joint pain.  All other systems reviewed and are negative.  Otherwise per HPI.  Assessment & Plan: Visit Diagnoses:    ICD-10-CM   1. Lumbar  radiculopathy  M54.16 XR C-ARM NO REPORT    Epidural Steroid injection    methylPREDNISolone  acetate (DEPO-MEDROL ) injection 40 mg    2. Chronic left-sided low back pain with left-sided sciatica  M54.42    G89.29     3. Stenosis of lateral recess of lumbar spine  M48.061     4. Radiculopathy due to lumbar intervertebral disc disorder  M51.16        Plan: Findings:  Chronic history of low back and left hip and thigh pain which had initially had multiple areas of diagnostic potential but seems to have settled on a lumbar spine issue with lateral recess narrowing to the disc protrusion.  Last injection almost a year ago did quite well.  She stays very active and continues with home exercise program.  I think the best approach is to repeat the left L4 transforaminal injection today.  Depending on relief would look at updated MRI potentially.  We may find that that disc has resolved to a degree.    Meds & Orders:  Meds ordered this encounter  Medications   methylPREDNISolone  acetate (DEPO-MEDROL ) injection 40 mg    Orders Placed This Encounter  Procedures   XR C-ARM NO REPORT   Epidural Steroid injection    Follow-up: Return if symptoms worsen or fail to improve.   Procedures: No procedures performed  Lumbosacral Transforaminal Epidural Steroid Injection - Sub-Pedicular Approach with  Fluoroscopic Guidance  Patient: Tamaya [place]. [place]      Date of Birth: [birth year] MRN: [id number] PCP: Eliodoro Guerin, DO      Visit Date: 11/15/2023   Universal Protocol:    Date/Time: 11/15/2023  Consent Given By: the patient  Position: PRONE  Additional Comments: Vital signs were monitored before and after the procedure. Patient was prepped and draped in the usual sterile fashion. The correct patient, procedure, and site was verified.   Injection Procedure Details:   Procedure diagnoses: Lumbar radiculopathy [M54.16]    Meds Administered:  Meds ordered this encounter  Medications    methylPREDNISolone  acetate (DEPO-MEDROL ) injection 40 mg    Laterality: Left  Location/Site: L4  Needle:5.0 in., 22 ga.  Short bevel or Quincke spinal needle  Needle Placement: Transforaminal  Findings:    -Comments: Excellent flow of contrast along the nerve, nerve root and into the epidural space.  Procedure Details: After squaring off the end-plates to get a true AP view, the C-arm was positioned so that an oblique view of the foramen as noted above was visualized. The target area is just inferior to the "nose of the scotty dog" or sub pedicular. The soft tissues overlying this structure were infiltrated with 2-3 ml. of 1% Lidocaine  without Epinephrine.  The spinal needle was inserted toward the target using a "trajectory" view along the fluoroscope beam.  Under AP and lateral visualization, the needle was advanced so it did not puncture dura and was located close the 6 O'Clock position of the pedical in AP tracterory. Biplanar projections were used to confirm position. Aspiration was confirmed to be negative for CSF and/or blood. A 1-2 ml. volume of Isovue-250 was injected and flow of contrast was noted at each level. Radiographs were obtained for documentation purposes.   After attaining the desired flow of contrast documented above, a 0.5 to 1.0 ml test dose of 0.25% Marcaine  was injected into each respective transforaminal space.  The patient was observed for 90 seconds post injection.  After no sensory deficits were reported, and normal lower extremity motor function was noted,   the above injectate was administered so that equal amounts of the injectate were placed at each foramen (level) into the transforaminal epidural space.   Additional Comments:  The patient tolerated the procedure well Dressing: 2 x 2 sterile gauze and Band-Aid    Post-procedure details: Patient was observed during the procedure. Post-procedure instructions were reviewed.  Patient left the clinic in  stable condition.    Clinical History: Lumbar Spine MRI 2022 IMPRESSION: Mild to moderate subarticular stenosis on the left at L3-4 due to disc degeneration and spurring. No significant spinal stenosis   Mild subarticular stenosis bilaterally L4-5   Mild right foraminal stenosis L5-S1 due to spurring.   She reports that she quit smoking about 38 years ago. Her smoking use included cigarettes. She started smoking about 53 years ago. She has a 15 pack-year smoking history. She has never used smokeless tobacco. No results for input(s): "HGBA1C", "LABURIC" in the last 8760 hours.  Objective:  VS:  HT:    WT:   BMI:     BP:122/83  HR:64bpm  TEMP: ( )  RESP:  Physical Exam Vitals and nursing note reviewed.  Constitutional:      General: She is not in acute distress.    Appearance: Normal appearance. She is well-developed. She is not ill-appearing.  HENT:     Head: Normocephalic and atraumatic.     Right Ear: External  ear normal.     Left Ear: External ear normal.     Nose: Nose normal.     Mouth/Throat:     Mouth: Mucous membranes are moist.     Pharynx: Oropharynx is clear.  Eyes:     Extraocular Movements: Extraocular movements intact.     Conjunctiva/sclera: Conjunctivae normal.     Pupils: Pupils are equal, round, and reactive to light.  Cardiovascular:     Rate and Rhythm: Normal rate and regular rhythm.     Pulses: Normal pulses.  Pulmonary:     Effort: Pulmonary effort is normal. No respiratory distress.  Abdominal:     General: There is no distension.     Palpations: Abdomen is soft.     Tenderness: There is no guarding.  Musculoskeletal:        General: Tenderness present.     Cervical back: Normal range of motion and neck supple.     Right lower leg: No edema.     Left lower leg: No edema.     Comments: Patient has good distal strength with no pain over the greater trochanters.  No clonus or focal weakness.  Skin:    General: Skin is warm and dry.      Findings: No erythema, lesion or rash.  Neurological:     General: No focal deficit present.     Mental Status: She is alert and oriented to person, place, and time.     Sensory: No sensory deficit.     Motor: No weakness or abnormal muscle tone.     Coordination: Coordination normal.     Gait: Gait normal.  Psychiatric:        Mood and Affect: Mood normal.        Behavior: Behavior normal.        Thought Content: Thought content normal.     Ortho Exam  Imaging: No results found.  Past Medical/Family/Surgical/Social History: Medications & Allergies reviewed per EMR, new medications updated. Patient Active Problem List   Diagnosis Date Noted   Brain cyst 09/03/2021   Adverse reaction to food, subsequent encounter 09/10/2020   Tick bite of neck 07/06/2020   Osteopenia 05/01/2017   Celiac disease 04/18/2017   Lyme disease 04/18/2017   History of squamous cell carcinoma 04/18/2017   Post-menopausal 04/18/2017   Past Medical History:  Diagnosis Date   Celiac disease 2008   Cyst of pineal gland    Family History  Problem Relation Age of Onset   Asthma Mother    Hypertension Mother    COPD Mother    Allergic rhinitis Mother    Hepatitis C Father    Breast cancer Sister    Celiac disease Sister    Thyroid  disease Sister    Celiac disease Brother    Heart attack Maternal Grandfather    Heart attack Paternal Grandfather    Celiac disease Sister    Thyroid  disease Sister    Colon cancer Neg Hx    Colon polyps Neg Hx    Esophageal cancer Neg Hx    Rectal cancer Neg Hx    Eczema Neg Hx    Urticaria Neg Hx    Past Surgical History:  Procedure Laterality Date   COLONOSCOPY     TONSILLECTOMY     Social History   Occupational History   Occupation: Retired  Tobacco Use   Smoking status: Former    Current packs/day: 0.00    Average packs/day: 1 pack/day for 15.0 years (  15.0 ttl pk-yrs)    Types: Cigarettes    Start date: 36    Quit date: 56    Years since  quitting: 38.4   Smokeless tobacco: Never  Vaping Use   Vaping status: Never Used  Substance and Sexual Activity   Alcohol use: Not Currently   Drug use: No   Sexual activity: Not on file

## 2024-01-04 ENCOUNTER — Encounter: Payer: Self-pay | Admitting: Physical Medicine and Rehabilitation

## 2024-01-04 ENCOUNTER — Telehealth: Payer: Self-pay | Admitting: Physical Medicine and Rehabilitation

## 2024-01-04 DIAGNOSIS — M5416 Radiculopathy, lumbar region: Secondary | ICD-10-CM

## 2024-01-04 NOTE — Telephone Encounter (Signed)
 Pt called requesting an appt. Last appt 11/2023. Pt phone number is [phone]

## 2024-01-10 ENCOUNTER — Telehealth: Payer: Self-pay | Admitting: Family Medicine

## 2024-01-10 NOTE — Telephone Encounter (Signed)
 Pt upset that office called her to schedule a mammo apt. Pt says that she is not a candidate for a mammo. She says that if her dr thinks otherwise and she will agree to schedule the mammo.

## 2024-01-30 ENCOUNTER — Other Ambulatory Visit: Payer: Self-pay

## 2024-01-30 ENCOUNTER — Ambulatory Visit: Admitting: Physical Medicine and Rehabilitation

## 2024-01-30 VITALS — BP 127/76 | HR 63

## 2024-01-30 DIAGNOSIS — M5416 Radiculopathy, lumbar region: Secondary | ICD-10-CM

## 2024-01-30 MED ORDER — METHYLPREDNISOLONE ACETATE 40 MG/ML IJ SUSP
40.0000 mg | Freq: Once | INTRAMUSCULAR | Status: AC
  Administered 2024-01-30: 40 mg

## 2024-01-30 NOTE — Patient Instructions (Signed)

## 2024-01-30 NOTE — Progress Notes (Unsigned)
 Pain Scale   Average Pain 9 Patient advising she has chronic left hip pain that is constant.        +Driver, -BT, -Dye Allergies.

## 2024-01-31 NOTE — Progress Notes (Signed)
 Joanna Perkins - 73 y.o. female MRN [id number]  Date of birth: [birth year]  Office Visit Note: Visit Date: 01/30/2024 PCP: Jolinda Norene HERO, DO Referred by: Jolinda Norene HERO, DO  Subjective: Chief Complaint  Patient presents with   Left Hip - Pain   HPI:  Joanna Perkins is a 73 y.o. female who comes in today for planned repeat Left L4-5  Lumbar Transforaminal epidural steroid injection with fluoroscopic guidance.  The patient has failed conservative care including home exercise, medications, time and activity modification.  This injection will be diagnostic and hopefully therapeutic.  Please see requesting physician notes for further details and justification. Patient received more than 50% pain relief from prior injection.   Depending on results this time since she did not get as good of results on the last injection I would consider updating MRI of the lumbar spine.  The last image was in 2022.  Referring: Duwaine Pouch, FNP   ROS Otherwise per HPI.  Assessment & Plan: Visit Diagnoses:    ICD-10-CM   1. Lumbar radiculopathy  M54.16 XR C-ARM NO REPORT    Epidural Steroid injection    methylPREDNISolone  acetate (DEPO-MEDROL ) injection 40 mg      Plan: No additional findings.   Meds & Orders:  Meds ordered this encounter  Medications   methylPREDNISolone  acetate (DEPO-MEDROL ) injection 40 mg    Orders Placed This Encounter  Procedures   XR C-ARM NO REPORT   Epidural Steroid injection    Follow-up: Return for visit to requesting provider as needed.   Procedures: No procedures performed  Lumbosacral Transforaminal Epidural Steroid Injection - Sub-Pedicular Approach with Fluoroscopic Guidance  [name]      Date of Birth: 03/27/1951 MRN: [id number] PCP: Jolinda Norene HERO, DO      Visit Date: 01/30/2024   Universal Protocol:    Date/Time: 01/30/2024  Consent Given By: the patient  Position: PRONE  Additional Comments: Vital signs were  monitored before and after the procedure. Patient was prepped and draped in the usual sterile fashion. The correct patient, procedure, and site was verified.   Injection Procedure Details:   Procedure diagnoses: Lumbar radiculopathy [M54.16]    Meds Administered:  Meds ordered this encounter  Medications   methylPREDNISolone  acetate (DEPO-MEDROL ) injection 40 mg    Laterality: Left  Location/Site: L4  Needle:5.0 in., 22 ga.  Short bevel or Quincke spinal needle  Needle Placement: Transforaminal  Findings:    -Comments: Excellent flow of contrast along the nerve, nerve root and into the epidural space.  Procedure Details: After squaring off the end-plates to get a true AP view, the C-arm was positioned so that an oblique view of the foramen as noted above was visualized. The target area is just inferior to the nose of the scotty dog or sub pedicular. The soft tissues overlying this structure were infiltrated with 2-3 ml. of 1% Lidocaine  without Epinephrine.  The spinal needle was inserted toward the target using a trajectory view along the fluoroscope beam.  Under AP and lateral visualization, the needle was advanced so it did not puncture dura and was located close the 6 O'Clock position of the pedical in AP tracterory. Biplanar projections were used to confirm position. Aspiration was confirmed to be negative for CSF and/or blood. A 1-2 ml. volume of Isovue-250 was injected and flow of contrast was noted at each level. Radiographs were obtained for documentation purposes.   After attaining the desired flow of contrast documented above, a  0.5 to 1.0 ml test dose of 0.25% Marcaine  was injected into each respective transforaminal space.  The patient was observed for 90 seconds post injection.  After no sensory deficits were reported, and normal lower extremity motor function was noted,   the above injectate was administered so that equal amounts of the injectate were placed at each  foramen (level) into the transforaminal epidural space.   Additional Comments:  The patient tolerated the procedure well Dressing: 2 x 2 sterile gauze and Band-Aid    Post-procedure details: Patient was observed during the procedure. Post-procedure instructions were reviewed.  Patient left the clinic in stable condition.    Clinical History: Lumbar Spine MRI 2022 IMPRESSION: Mild to moderate subarticular stenosis on the left at L3-4 due to disc degeneration and spurring. No significant spinal stenosis   Mild subarticular stenosis bilaterally L4-5   Mild right foraminal stenosis L5-S1 due to spurring.     Objective:  VS:  HT:    WT:   BMI:     BP:127/76  HR:63bpm  TEMP: ( )  RESP:  Physical Exam Vitals and nursing note reviewed.  Constitutional:      General: She is not in acute distress.    Appearance: Normal appearance. She is not ill-appearing.  HENT:     Head: Normocephalic and atraumatic.     Right Ear: External ear normal.     Left Ear: External ear normal.  Eyes:     Extraocular Movements: Extraocular movements intact.  Cardiovascular:     Rate and Rhythm: Normal rate.     Pulses: Normal pulses.  Pulmonary:     Effort: Pulmonary effort is normal. No respiratory distress.  Abdominal:     General: There is no distension.     Palpations: Abdomen is soft.  Musculoskeletal:        General: Tenderness present.     Cervical back: Neck supple.     Right lower leg: No edema.     Left lower leg: No edema.     Comments: Patient has good distal strength with no pain over the greater trochanters.  No clonus or focal weakness.  Skin:    Findings: No erythema, lesion or rash.  Neurological:     General: No focal deficit present.     Mental Status: She is alert and oriented to person, place, and time.     Sensory: No sensory deficit.     Motor: No weakness or abnormal muscle tone.     Coordination: Coordination normal.  Psychiatric:        Mood and Affect:  Mood normal.        Behavior: Behavior normal.      Imaging: No results found.

## 2024-01-31 NOTE — Procedures (Signed)
 Lumbosacral Transforaminal Epidural Steroid Injection - Sub-Pedicular Approach with Fluoroscopic Guidance  [name]      Date of Birth: 1951-03-06 MRN: [id number] PCP: Jolinda Norene HERO, DO      Visit Date: 01/30/2024   Universal Protocol:    Date/Time: 01/30/2024  Consent Given By: the patient  Position: PRONE  Additional Comments: Vital signs were monitored before and after the procedure. Patient was prepped and draped in the usual sterile fashion. The correct patient, procedure, and site was verified.   Injection Procedure Details:   Procedure diagnoses: Lumbar radiculopathy [M54.16]    Meds Administered:  Meds ordered this encounter  Medications   methylPREDNISolone  acetate (DEPO-MEDROL ) injection 40 mg    Laterality: Left  Location/Site: L4  Needle:5.0 in., 22 ga.  Short bevel or Quincke spinal needle  Needle Placement: Transforaminal  Findings:    -Comments: Excellent flow of contrast along the nerve, nerve root and into the epidural space.  Procedure Details: After squaring off the end-plates to get a true AP view, the C-arm was positioned so that an oblique view of the foramen as noted above was visualized. The target area is just inferior to the nose of the scotty dog or sub pedicular. The soft tissues overlying this structure were infiltrated with 2-3 ml. of 1% Lidocaine  without Epinephrine.  The spinal needle was inserted toward the target using a trajectory view along the fluoroscope beam.  Under AP and lateral visualization, the needle was advanced so it did not puncture dura and was located close the 6 O'Clock position of the pedical in AP tracterory. Biplanar projections were used to confirm position. Aspiration was confirmed to be negative for CSF and/or blood. A 1-2 ml. volume of Isovue-250 was injected and flow of contrast was noted at each level. Radiographs were obtained for documentation purposes.   After attaining the desired  flow of contrast documented above, a 0.5 to 1.0 ml test dose of 0.25% Marcaine  was injected into each respective transforaminal space.  The patient was observed for 90 seconds post injection.  After no sensory deficits were reported, and normal lower extremity motor function was noted,   the above injectate was administered so that equal amounts of the injectate were placed at each foramen (level) into the transforaminal epidural space.   Additional Comments:  The patient tolerated the procedure well Dressing: 2 x 2 sterile gauze and Band-Aid    Post-procedure details: Patient was observed during the procedure. Post-procedure instructions were reviewed.  Patient left the clinic in stable condition.

## 2024-02-01 ENCOUNTER — Encounter: Admitting: Physical Medicine and Rehabilitation

## 2024-02-05 ENCOUNTER — Telehealth: Payer: Self-pay | Admitting: Neurology

## 2024-02-05 ENCOUNTER — Ambulatory Visit: Admitting: Neurology

## 2024-02-05 NOTE — Telephone Encounter (Signed)
 LVM and sent mychart msg informing pt of need to reschedule 02/05/24 appt - MD out

## 2024-03-13 ENCOUNTER — Telehealth: Payer: Self-pay | Admitting: Neurology

## 2024-03-13 ENCOUNTER — Encounter: Payer: Self-pay | Admitting: Family Medicine

## 2024-03-13 DIAGNOSIS — R413 Other amnesia: Secondary | ICD-10-CM

## 2024-03-13 NOTE — Telephone Encounter (Signed)
 Pt called to cancel appt  due to not needing appt  anymore   APPT Canceled

## 2024-03-14 ENCOUNTER — Ambulatory Visit: Admitting: Neurology

## 2024-03-22 ENCOUNTER — Encounter: Payer: Self-pay | Admitting: Family Medicine

## 2024-03-27 ENCOUNTER — Encounter: Payer: Self-pay | Admitting: Family Medicine

## 2024-04-09 ENCOUNTER — Encounter: Payer: Self-pay | Admitting: *Deleted

## 2024-04-10 ENCOUNTER — Encounter (INDEPENDENT_AMBULATORY_CARE_PROVIDER_SITE_OTHER): Payer: Self-pay | Admitting: Family Medicine

## 2024-04-10 DIAGNOSIS — K047 Periapical abscess without sinus: Secondary | ICD-10-CM

## 2024-04-10 MED ORDER — AMOXICILLIN 500 MG PO CAPS: 500.0000 mg | ORAL_CAPSULE | Freq: Two times a day (BID) | ORAL | 0 refills | Status: AC

## 2024-04-10 NOTE — Telephone Encounter (Signed)

## 2024-05-06 ENCOUNTER — Encounter: Payer: Self-pay | Admitting: Physical Medicine and Rehabilitation

## 2024-05-06 ENCOUNTER — Encounter: Payer: Self-pay | Admitting: Radiology

## 2024-05-07 ENCOUNTER — Other Ambulatory Visit: Payer: Self-pay | Admitting: Physical Medicine and Rehabilitation

## 2024-05-10 ENCOUNTER — Encounter: Payer: Self-pay | Admitting: Family Medicine

## 2024-05-10 DIAGNOSIS — M5416 Radiculopathy, lumbar region: Secondary | ICD-10-CM

## 2024-05-10 DIAGNOSIS — Z780 Asymptomatic menopausal state: Secondary | ICD-10-CM

## 2024-05-15 NOTE — Addendum Note (Signed)
 Addended by: JOLINDA NORENE HERO on: 05/15/2024 12:06 PM   Modules accepted: Orders

## 2024-05-24 NOTE — Telephone Encounter (Signed)
 Any update on pain management referral that was placed on 11/7?

## 2024-06-10 ENCOUNTER — Encounter: Payer: Self-pay | Admitting: Neurology

## 2024-06-10 ENCOUNTER — Ambulatory Visit: Admitting: Neurology

## 2024-06-10 VITALS — BP 131/75 | HR 83 | Resp 14 | Ht 67.0 in | Wt 137.0 lb

## 2024-06-10 DIAGNOSIS — R413 Other amnesia: Secondary | ICD-10-CM | POA: Insufficient documentation

## 2024-06-10 NOTE — Progress Notes (Addendum)
 Chief Complaint  Patient presents with   Follow-up    Rm12, alone, brain cyst f/u       ASSESSMENT AND PLAN  Joanna Perkins is a 73 y.o. female   Mild cognitive impairment  MoCA examination 23/30 on September 03, 2021,22/30 in Dec 2025  Family history of dementia  MRI of the brain showed incidental benign left basal ganglion cyst, chronic, no mass effect,  Laboratory evaluation previously showed no treatable etiology  She would like to proceed with further evaluation for possible central nervous system degenerative disorder such as Alzheimer's disease, ATN profile, APO E, amyloid scan, she is a potential candidate for amyloid ajovy agent treatment.  Return in 6 months with her husband  DIAGNOSTIC DATA (LABS, IMAGING, TESTING) - I reviewed patient records, labs, notes, testing and imaging myself where available.   MEDICAL HISTORY:  Joanna Perkins, is a 73 year old female seen in request by her primary care physician Dr. Jolinda, Norene HERO, for evaluation of memory loss, initial evaluation was September 03, 2020  I reviewed and summarized the referring note. 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  Over the years, with her field work, she suffered multiple tick bite, was treated for Lyme disease in the past, seeing a homeopathic physician at [place] , over the years, she has occasionally flareup of possible Lyme disease, often presented as confusion, memory loss, mild fever  At the end of 2022, she noticed intermittent memory loss, confusion spells, hard to find most efficient way to finish multitask, this triggered MRI of the brain I personally reviewed the film from August 10, 2021, no acute abnormality, age-appropriate at atrophy, round smooth margin  cystic lesion at left basal ganglion, most likely has been present all her life  She denies focal signs, with her joint issue improved, she become more physically active, her memory has improved, today's MoCA examination 23/30, she has missed 5 out of 5 short-term recall.  Laboratory evaluation in January 2023 showed normal CBC, TSH B12, CMP  UPDATE Dec 8th 2025: She returns concerning her memory, sleeps better, reported memory stabilized,  her older sister was diagnosed with dementia  at 34. She retired as a Occupational Psychologist, was the founding Economist, She had gradual onset of memory loss over the past few years, has to take frequent note, such as going down to baseline to get tools, she is still driving, not lost, physically active, hiking, walk her 3 dogs regularly, 3 to 4 miles each day,  Has celiac disease, also reported a history of recurrent Lyme disease, x3 times, tick bites, was treated with antibiotics,  MoCA 22/30 today  PHYSICAL EXAM:   Vitals:   06/10/24 0903  BP: 131/75  Pulse: 83  Resp: 14  Weight: 137 lb (62.1 kg)  Height: 5' 7 (1.702 m)     Body mass index is 21.46 kg/m.  PHYSICAL EXAMNIATION:  Gen: NAD, conversant, well nourised, well groomed                     Cardiovascular: Regular rate rhythm, no peripheral edema, warm, nontender. Eyes: Conjunctivae clear without exudates or hemorrhage Neck: Supple, no carotid bruits. Pulmonary: Clear to auscultation bilaterally   NEUROLOGICAL EXAM:  MENTAL STATUS: Speech/cognition: awake, alert,  oriented to history taking and casual conversation.      06/10/2024    9:00 AM 09/03/2021   10:00 AM  Montreal Cognitive Assessment   Visuospatial/ Executive (0/5) 5 4  Naming (0/3) 3 3  Attention: Read list of digits (0/2) 2 2  Attention: Read list of letters (0/1) 1 1  Attention: Serial 7 subtraction starting at 100 (0/3) 1 3  Language: Repeat phrase (0/2) 2 2  Language :  Fluency (0/1) 1 1  Abstraction (0/2) 2 2  Delayed Recall (0/5) 0 0  Orientation (0/6) 5 5  Total 22 23    CRANIAL NERVES: CN II: Visual fields are full to confrontation. Pupils are round equal and briskly reactive to light. CN III, IV, VI: extraocular movement are normal. No ptosis. CN V: Facial sensation is intact to light touch CN VII: Face is symmetric with normal eye closure  CN VIII: Hearing is normal to causal conversation. CN IX, X: Phonation is normal. CN XI: Head turning and shoulder shrug are intact  MOTOR: There is no pronator drift of out-stretched arms. Muscle bulk and tone are normal. Muscle strength is normal.  REFLEXES: Reflexes are 2+ and symmetric at the biceps, triceps, knees, and ankles. Plantar responses are flexor.  SENSORY: Intact to light touch, pinprick and vibratory sensation are intact in fingers and toes.  COORDINATION: There is no trunk or limb dysmetria noted.  GAIT/STANCE: Posture is normal. Gait is steady   REVIEW OF SYSTEMS:  Full 14 system review of systems performed and notable only for as above All other review of systems were negative.   ALLERGIES: Allergies  Allergen Reactions   Gluten Meal     HOME MEDICATIONS: Current Outpatient Medications  Medication Sig Dispense Refill   b complex vitamins capsule Take 1 capsule by mouth daily.     CHELATED MAGNESIUM PO Take 400 mg by mouth.     doxycycline  (VIBRA -TABS) 100 MG tablet Take 1 tablet twice daily x14 days. 28 tablet 0   Ginkgo Biloba (GINKGO PO) Take 120 mg by mouth.     Iodine, Kelp, (KELP PO) Take 600 mg by mouth.     LORazepam  (ATIVAN ) 0.5 MG tablet Take 0.25-0.5 mg by mouth at bedtime as needed.     Misc Natural Products (GINSENG COMPLEX PO) Take 400 mg by mouth.     Multiple Vitamins-Minerals (ZINC PO) Take 22.5 mg by mouth.     TURMERIC PO Take 1 capsule by mouth daily.     UNABLE TO FIND Med Name: Passion Flower Tincture ~34mL     UNABLE TO FIND 360 mg. Med Name: Rax B       UNABLE TO FIND 750 mg. Med Name: Curamed     VITAMIN D  PO Take 25 mcg by mouth daily.     VITAMIN E PO Take 670 mg by mouth.     No current facility-administered medications for this visit.    PAST MEDICAL HISTORY: Past Medical History:  Diagnosis Date   Celiac disease 2008   Cyst of pineal gland     PAST SURGICAL HISTORY: Past Surgical History:  Procedure Laterality Date   COLONOSCOPY     TONSILLECTOMY      FAMILY HISTORY: Family History  Problem Relation Age of Onset   Asthma Mother    Hypertension Mother    COPD Mother    Allergic rhinitis Mother    Hepatitis C Father    Breast cancer Sister    Celiac disease Sister  Thyroid  disease Sister    Celiac disease Brother    Heart attack Maternal Grandfather    Heart attack Paternal Grandfather    Celiac disease Sister    Thyroid  disease Sister    Colon cancer Neg Hx    Colon polyps Neg Hx    Esophageal cancer Neg Hx    Rectal cancer Neg Hx    Eczema Neg Hx    Urticaria Neg Hx     SOCIAL HISTORY: Social History   Socioeconomic History   Marital status: Married    Spouse name: Marinda   Number of children: 0   Years of education: college   Highest education level: Doctorate  Occupational History   Occupation: Retired  Tobacco Use   Smoking status: Former    Current packs/day: 0.00    Average packs/day: 1 pack/day for 15.0 years (15.0 ttl pk-yrs)    Types: Cigarettes    Start date: 95    Quit date: 1987    Years since quitting: 38.9   Smokeless tobacco: Never  Vaping Use   Vaping status: Never Used  Substance and Sexual Activity   Alcohol use: Not Currently   Drug use: No   Sexual activity: Not on file  Other Topics Concern   Not on file  Social History Narrative   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 She recently relocated resides with her husband Marinda.  No biologic children but she does have one stepchild and 2 grandchildren. She prefers  holistic approach to her healthcare.   Right-handed.   Tea each morning, occasional coffee.   Social Drivers of Corporate Investment Banker Strain: Low Risk  (10/24/2023)   Overall Financial Resource Strain (CARDIA)    Difficulty of Paying Living Expenses: Not hard at all  Food Insecurity: No Food Insecurity (10/24/2023)   Hunger Vital Sign    Worried About Running Out of Food in the Last Year: Never true    Ran Out of Food in the Last Year: Never true  Transportation Needs: No Transportation Needs (10/24/2023)   PRAPARE - Administrator, Civil Service (Medical): No    Lack of Transportation (Non-Medical): No  Physical Activity: Sufficiently Active (10/24/2023)   Exercise Vital Sign    Days of Exercise per Week: 7 days    Minutes of Exercise per Session: 150+ min  Stress: Stress Concern Present (10/24/2023)   [employer] of Occupational Health - Occupational Stress Questionnaire    Feeling of Stress : To some extent  Social Connections: Moderately Isolated (10/24/2023)   Social Connection and Isolation Panel    Frequency of Communication with Friends and Family: Twice a week    Frequency of Social Gatherings with Friends and Family: Once a week    Attends Religious Services: Never    Database Administrator or Organizations: No    Attends Engineer, Structural: Not on file    Marital Status: Married  Catering Manager Violence: Not on file      Modena Callander, M.D. Ph.D.  [facility] [address] [place], [place] [address] Ph: [phone] Fax: [phone]  CC:  Jolinda Norene HERO, DO [address]  [place] [address]  Jolinda Norene HERO, DO

## 2024-06-11 ENCOUNTER — Ambulatory Visit: Payer: Self-pay | Admitting: Neurology

## 2024-06-13 ENCOUNTER — Telehealth: Payer: Self-pay | Admitting: Neurology

## 2024-06-13 NOTE — Telephone Encounter (Signed)
 I submitted everything to her insurance and they are asking that your note be updated to say: Documentation that the patient was considered or candidate for anti-amyloid therapy

## 2024-06-15 ENCOUNTER — Encounter: Payer: Self-pay | Admitting: Family Medicine

## 2024-06-17 ENCOUNTER — Telehealth: Payer: Self-pay

## 2024-06-17 NOTE — Telephone Encounter (Unsigned)
 Copied from CRM #[id number]. Topic: Referral - Question >> Jun 17, 2024  8:28 AM Miquel SAILOR wrote: Reason for CRM: PT can not find referral. Found and gave info   [facility] [place] [address] [place] [address] [phone] >> Jun 17, 2024  8:59 AM Farrel B wrote: Pt called back to check status of referral for pet scan and pain management, advised pt of the process time 7-10 business days, she asked why she hadn't received a call as of yet, also wanting to know if I saw any information on the referral. Advised pt of referral numbers for pt pet scan and pain management along with phone number to the clinic.

## 2024-06-17 NOTE — Telephone Encounter (Signed)
 Pt called to follow up abut PET Scan   getting scheduled ,  Spoke to Coordination , Mention that  we are still waiting for Insurance approval  , Once approved [place] will call PT . I  Informed  pt of message  the patient stated she will wait on call

## 2024-06-17 NOTE — Telephone Encounter (Signed)
 Copied from CRM #[id number]. Topic: Referral - Question >> Jun 17, 2024  8:28 AM Miquel SAILOR wrote: Reason for CRM: PT can not find referral. Found and gave info   [facility] [place] [address], [address] [place] [address] [phone]

## 2024-06-17 NOTE — Telephone Encounter (Signed)
 Callled NOV[facility] to confirm status. Resent the referral to fax number to [phone].

## 2024-06-18 ENCOUNTER — Telehealth: Payer: Self-pay | Admitting: Neurology

## 2024-06-18 ENCOUNTER — Encounter: Payer: Self-pay | Admitting: Family Medicine

## 2024-06-18 LAB — CBC WITH DIFFERENTIAL/PLATELET
Basophils Absolute: 0 x10E3/uL (ref 0.0–0.2)
Basos: 1 %
EOS (ABSOLUTE): 0.1 x10E3/uL (ref 0.0–0.4)
Eos: 1 %
Hematocrit: 40.2 % (ref 34.0–46.6)
Hemoglobin: 13.2 g/dL (ref 11.1–15.9)
Immature Grans (Abs): 0 x10E3/uL (ref 0.0–0.1)
Immature Granulocytes: 0 %
Lymphocytes Absolute: 1.8 x10E3/uL (ref 0.7–3.1)
Lymphs: 30 %
MCH: 30.4 pg (ref 26.6–33.0)
MCHC: 32.8 g/dL (ref 31.5–35.7)
MCV: 93 fL (ref 79–97)
Monocytes Absolute: 0.4 x10E3/uL (ref 0.1–0.9)
Monocytes: 7 %
Neutrophils Absolute: 3.7 x10E3/uL (ref 1.4–7.0)
Neutrophils: 61 %
Platelets: 222 x10E3/uL (ref 150–450)
RBC: 4.34 x10E6/uL (ref 3.77–5.28)
RDW: 12 % (ref 11.7–15.4)
WBC: 6 x10E3/uL (ref 3.4–10.8)

## 2024-06-18 LAB — COMPREHENSIVE METABOLIC PANEL WITH GFR
ALT: 19 IU/L (ref 0–32)
AST: 22 IU/L (ref 0–40)
Albumin: 4.1 g/dL (ref 3.8–4.8)
Alkaline Phosphatase: 69 IU/L (ref 49–135)
BUN/Creatinine Ratio: 24 (ref 12–28)
BUN: 22 mg/dL (ref 8–27)
Bilirubin Total: 0.4 mg/dL (ref 0.0–1.2)
CO2: 25 mmol/L (ref 20–29)
Calcium: 9 mg/dL (ref 8.7–10.3)
Chloride: 104 mmol/L (ref 96–106)
Creatinine, Ser: 0.92 mg/dL (ref 0.57–1.00)
Globulin, Total: 2.1 g/dL (ref 1.5–4.5)
Glucose: 65 mg/dL — ABNORMAL LOW (ref 70–99)
Potassium: 4.3 mmol/L (ref 3.5–5.2)
Sodium: 141 mmol/L (ref 134–144)
Total Protein: 6.2 g/dL (ref 6.0–8.5)
eGFR: 66 mL/min/1.73 (ref >59)

## 2024-06-18 LAB — TSH: TSH: 1.56 u[IU]/mL (ref 0.450–4.500)

## 2024-06-18 LAB — VITAMIN B12: Vitamin B-12: 752 pg/mL (ref 232–1245)

## 2024-06-18 LAB — APOE ALZHEIMER'S DISEASE RISK

## 2024-06-18 LAB — ATN PROFILE
A -- Beta-amyloid 42/40 Ratio: 0.107 (ref >0.102)
Beta-amyloid 40: 144.5 pg/mL
Beta-amyloid 42: 15.45 pg/mL
N -- NfL, Plasma: 2.38 pg/mL (ref 0.00–6.04)
T -- p-tau181: 1.36 pg/mL — AB (ref 0.00–0.97)

## 2024-06-18 LAB — LYME DISEASE SEROLOGY W/REFLEX: Lyme Total Antibody EIA: NEGATIVE

## 2024-06-18 LAB — SYPHILIS: RPR W/REFLEX TO RPR TITER AND TREPONEMAL ANTIBODIES, TRADITIONAL SCREENING AND DIAGNOSIS ALGORITHM: RPR Ser Ql: NONREACTIVE

## 2024-06-18 NOTE — Telephone Encounter (Signed)
 Patient inquired about scheduling of her brain PET amyloid scan. Order has been placed, please make sure that the study is scheduled

## 2024-06-19 NOTE — Telephone Encounter (Signed)
 Pet scan was approved. Auth: [id number] exp. 06/13/24-08/12/24 sent to [facility].

## 2024-06-19 NOTE — Telephone Encounter (Signed)
 I uploaded the updated note to her insurance.

## 2024-07-05 ENCOUNTER — Encounter (HOSPITAL_COMMUNITY)
Admission: RE | Admit: 2024-07-05 | Discharge: 2024-07-05 | Disposition: A | Discharge status: Home / Self Care | Source: Ambulatory Visit | Attending: Neurology | Admitting: Neurology

## 2024-07-05 DIAGNOSIS — R413 Other amnesia: Secondary | ICD-10-CM | POA: Insufficient documentation

## 2024-07-05 MED ORDER — FLORBETAPIR F 18 500-1900 MBQ/ML IV SOLN
10.5600 | Freq: Once | INTRAVENOUS | Status: AC
  Administered 2024-07-05: 10.56 via INTRAVENOUS

## 2024-07-08 ENCOUNTER — Encounter: Payer: Self-pay | Admitting: Family Medicine

## 2024-07-10 ENCOUNTER — Encounter: Payer: Self-pay | Admitting: Neurology

## 2024-07-11 ENCOUNTER — Ambulatory Visit: Admitting: Neurology

## 2024-07-11 ENCOUNTER — Telehealth: Payer: Self-pay | Admitting: Neurology

## 2024-07-11 VITALS — BP 150/80 | HR 88 | Wt 136.5 lb

## 2024-07-11 DIAGNOSIS — R413 Other amnesia: Secondary | ICD-10-CM

## 2024-07-11 DIAGNOSIS — F02A0 Dementia in other diseases classified elsewhere, mild, without behavioral disturbance, psychotic disturbance, mood disturbance, and anxiety: Secondary | ICD-10-CM | POA: Diagnosis not present

## 2024-07-11 DIAGNOSIS — G309 Alzheimer's disease, unspecified: Secondary | ICD-10-CM

## 2024-07-11 DIAGNOSIS — G301 Alzheimer's disease with late onset: Secondary | ICD-10-CM

## 2024-07-11 MED ORDER — MEMANTINE HCL 10 MG PO TABS: 10.0000 mg | ORAL_TABLET | Freq: Two times a day (BID) | ORAL | 11 refills | Status: AC

## 2024-07-11 MED ORDER — DONEPEZIL HCL 10 MG PO TABS: 10.0000 mg | ORAL_TABLET | Freq: Every day | ORAL | 3 refills | Status: AC

## 2024-07-11 NOTE — Progress Notes (Signed)
 "  Chief Complaint  Patient presents with   Follow-up    Pt in room 14. Alone. Here to discuss Discuss amyloid treatment.       ASSESSMENT AND PLAN  Joanna Perkins is a 74 y.o. female   Mild late onset Alzheimer's disease  MoCA examination 23/30 on September 03, 2021,22/30 in Dec 2025  Family history of dementia  MRI of the brain showed incidental benign left basal ganglion cyst, chronic, no mass effect,  Laboratory evaluation showed no treatable etiology  A-T+N-, ApoE4/E4  Brain amyloid scan was positive in December 2025  She would like to proceed with amyloid removing agent, we went over [place] in detail, including increased chance of amyloid related imaging abnormality with her being homozygous for APO E4, she still want to proceed,  Start prior authorization,  Also started Namenda  10 mg twice a day, add on Aricept  10 mg daily  Encourage healthy lifestyle, moderate exercise  Return to clinic in 6 months  DIAGNOSTIC DATA (LABS, IMAGING, TESTING) - I reviewed patient records, labs, notes, testing and imaging myself where available.   MEDICAL HISTORY:  Joanna Eden. Perkins, is a 74 year old female seen in request by her primary care physician Dr. Jolinda, Norene HERO, for evaluation of memory loss, initial evaluation was September 03, 2020  I reviewed and summarized the referring note. 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  Over the years, with her field work, she suffered multiple tick bite, was treated for Lyme disease in the past, seeing a homeopathic physician at [place] , over the years, she has occasionally flareup of possible Lyme disease, often presented as confusion, memory loss, mild fever  At the end of 2022, she noticed intermittent memory loss,  confusion spells, hard to find most efficient way to finish multitask, this triggered MRI of the brain I personally reviewed the film from August 10, 2021, no acute abnormality, age-appropriate at atrophy, round smooth margin cystic lesion at left basal ganglion, most likely has been present all her life  She denies focal signs, with her joint issue improved, she become more physically active, her memory has improved, today's MoCA examination 23/30, she has missed 5 out of 5 short-term recall.  Laboratory evaluation in January 2023 showed normal CBC, TSH B12, CMP  UPDATE Dec 8th 2025: She returns concerning her memory, sleeps better, reported memory stabilized,  her older sister was diagnosed with dementia  at 56. She retired as a Occupational Psychologist, was the founding Economist, She had gradual onset of memory loss over the past few years, has to take frequent note, such as going down to baseline to get tools, she is still driving, not lost, physically active, hiking, walk her 3 dogs regularly, 3 to 4 miles each day,  Has celiac disease, also reported a history of recurrent Lyme disease, x3 times, tick bites, was treated with antibiotics,  MoCA 22/30 today  UPDATE Jan 8th 2026: Patient has mild late onset Alzheimer's disease, returning alone to discuss workup findings and treatment options, Brain amyloid PET scan was positive on July 07, 2024, A-T+N- ApOE4/E4, Laboratory evaluation showed no treatable cause for her memory complaints, Strong family history, she is member 3 of 4 siblings, 2 of her siblings, especially her 56 year old sister is  going  through the same thing, receiving infusion  She is homozygous for ApOE4, we discussed increased chance of amyloid related  Imaging abnormality (ARIA), she would like to proceed with Kinsula treatment.  PHYSICAL EXAM:   Vitals:   07/11/24 1008  BP: (!) 150/80  Pulse: 88  Weight: 136 lb 8 oz (61.9 kg)      Body mass index is 21.38 kg/m.  PHYSICAL EXAMNIATION:  Gen: NAD, conversant, well nourised, well groomed                     Cardiovascular: Regular rate rhythm, no peripheral edema, warm, nontender. Eyes: Conjunctivae clear without exudates or hemorrhage Neck: Supple, no carotid bruits. Pulmonary: Clear to auscultation bilaterally   NEUROLOGICAL EXAM:  MENTAL STATUS: Speech/cognition: awake, alert, oriented to history taking and casual conversation.      06/10/2024    9:00 AM 09/03/2021   10:00 AM  Montreal Cognitive Assessment   Visuospatial/ Executive (0/5) 5 4  Naming (0/3) 3 3  Attention: Read list of digits (0/2) 2 2  Attention: Read list of letters (0/1) 1 1  Attention: Serial 7 subtraction starting at 100 (0/3) 1 3  Language: Repeat phrase (0/2) 2 2  Language : Fluency (0/1) 1 1  Abstraction (0/2) 2 2  Delayed Recall (0/5) 0 0  Orientation (0/6) 5 5  Total 22 23    CRANIAL NERVES: CN II: Visual fields are full to confrontation. Pupils are round equal and briskly reactive to light. CN III, IV, VI: extraocular movement are normal. No ptosis. CN V: Facial sensation is intact to light touch CN VII: Face is symmetric with normal eye closure  CN VIII: Hearing is normal to causal conversation. CN IX, X: Phonation is normal. CN XI: Head turning and shoulder shrug are intact  MOTOR: There is no pronator drift of out-stretched arms. Muscle bulk and tone are normal. Muscle strength is normal.  REFLEXES: Reflexes are 2+ and symmetric at the biceps, triceps, knees, and ankles. Plantar responses are flexor.  SENSORY: Intact to light touch, pinprick and vibratory sensation are intact in fingers and toes.  COORDINATION: There is no trunk or limb dysmetria noted.  GAIT/STANCE: Posture is normal. Gait is steady   REVIEW OF SYSTEMS:  Full 14 system review of systems performed and notable only for as above All other review of systems were  negative.   ALLERGIES: Allergies  Allergen Reactions   Gluten Meal     HOME MEDICATIONS: Current Outpatient Medications  Medication Sig Dispense Refill   b complex vitamins capsule Take 1 capsule by mouth daily.     CHELATED MAGNESIUM PO Take 400 mg by mouth.     doxycycline  (VIBRA -TABS) 100 MG tablet Take 1 tablet twice daily x14 days. 28 tablet 0   Ginkgo Biloba (GINKGO PO) Take 120 mg by mouth.     Iodine, Kelp, (KELP PO) Take 600 mg by mouth.     LORazepam  (ATIVAN ) 0.5 MG tablet Take 0.25-0.5 mg by mouth at bedtime as needed.     Misc Natural Products (GINSENG COMPLEX PO) Take 400 mg by mouth.     Multiple Vitamins-Minerals (ZINC PO) Take 22.5 mg by mouth.     TURMERIC PO Take 1 capsule by mouth daily.     UNABLE TO FIND Med Name: Passion Flower Tincture ~56mL     UNABLE TO FIND 360 mg. Med Name: Rax B      UNABLE TO FIND 750 mg. Med Name: Curamed  VITAMIN D  PO Take 25 mcg by mouth daily.     VITAMIN E PO Take 670 mg by mouth.     No current facility-administered medications for this visit.    PAST MEDICAL HISTORY: Past Medical History:  Diagnosis Date   Celiac disease 2008   Cyst of pineal gland     PAST SURGICAL HISTORY: Past Surgical History:  Procedure Laterality Date   COLONOSCOPY     TONSILLECTOMY      FAMILY HISTORY: Family History  Problem Relation Age of Onset   Asthma Mother    Hypertension Mother    COPD Mother    Allergic rhinitis Mother    Hepatitis C Father    Breast cancer Sister    Celiac disease Sister    Thyroid  disease Sister    Celiac disease Brother    Heart attack Maternal Grandfather    Heart attack Paternal Grandfather    Celiac disease Sister    Thyroid  disease Sister    Colon cancer Neg Hx    Colon polyps Neg Hx    Esophageal cancer Neg Hx    Rectal cancer Neg Hx    Eczema Neg Hx    Urticaria Neg Hx     SOCIAL HISTORY: Social History   Socioeconomic History   Marital status: Married    Spouse name: Marinda    Number of children: 0   Years of education: college   Highest education level: Doctorate  Occupational History   Occupation: Retired  Tobacco Use   Smoking status: Former    Current packs/day: 0.00    Average packs/day: 1 pack/day for 15.0 years (15.0 ttl pk-yrs)    Types: Cigarettes    Start date: 52    Quit date: 1987    Years since quitting: 39.0   Smokeless tobacco: Never  Vaping Use   Vaping status: Never Used  Substance and Sexual Activity   Alcohol use: Not Currently   Drug use: No   Sexual activity: Not on file  Other Topics Concern   Not on file  Social History Narrative   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 She recently relocated resides with her husband Marinda.  No biologic children but she does have one stepchild and 2 grandchildren. She prefers holistic approach to her healthcare.   Right-handed.   Tea each morning, occasional coffee.   Social Drivers of Health   Tobacco Use: Low Risk (06/24/2024)   Received from [facility]   Patient History    Smoking Tobacco Use: Never    Smokeless Tobacco Use: Never    Passive Exposure: Never  Recent Concern: Tobacco Use - Medium Risk (06/10/2024)   Patient History    Smoking Tobacco Use: Former    Smokeless Tobacco Use: Never    Passive Exposure: Not on file  Financial Resource Strain: Low Risk (10/24/2023)   Overall Financial Resource Strain (CARDIA)    Difficulty of Paying Living Expenses: Not hard at all  Food Insecurity: No Food Insecurity (10/24/2023)   Hunger Vital Sign    Worried About Running Out of Food in the Last Year: Never true    Ran Out of Food in the Last Year: Never true  Transportation Needs: No Transportation Needs (10/24/2023)   PRAPARE - Administrator, Civil Service (Medical): No    Lack of Transportation (Non-Medical): No  Physical Activity: Sufficiently Active (10/24/2023)   Exercise Vital Sign  Days of Exercise per Week: 7 days     Minutes of Exercise per Session: 150+ min  Stress: Stress Concern Present (10/24/2023)   [employer] of Occupational Health - Occupational Stress Questionnaire    Feeling of Stress : To some extent  Social Connections: Moderately Isolated (10/24/2023)   Social Connection and Isolation Panel    Frequency of Communication with Friends and Family: Twice a week    Frequency of Social Gatherings with Friends and Family: Once a week    Attends Religious Services: Never    Database Administrator or Organizations: No    Attends Engineer, Structural: Not on file    Marital Status: Married  Catering Manager Violence: Not on file  Depression (PHQ2-9): Low Risk (10/27/2023)   Depression (PHQ2-9)    PHQ-2 Score: 1  Alcohol Screen: Low Risk (10/24/2023)   Alcohol Screen    Last Alcohol Screening Score (AUDIT): 1  Housing: Low Risk (10/24/2023)   Housing Stability Vital Sign    Unable to Pay for Housing in the Last Year: No    Number of Times Moved in the Last Year: 0    Homeless in the Last Year: No  Utilities: Not on file  Health Literacy: Not on file      Modena Callander, M.D. Ph.D.  [facility] [address] [place], [place] [address] Ph: [phone] Fax: [phone]  CC:  Jolinda Norene HERO, DO [address] [place],  [place] [address]  Jolinda Norene HERO, DO    I personally spent a total of 40 minutes in the care of the patient today including preparing to see the patient, getting/reviewing separately obtained history, performing a medically appropriate exam/evaluation, counseling and educating, placing orders, documenting clinical information in the EHR, independently interpreting results, communicating results, and coordinating care.  "

## 2024-07-11 NOTE — Telephone Encounter (Signed)
MRI order sent to [facility] [phone]

## 2024-07-11 NOTE — Telephone Encounter (Signed)
 Dr.Yan okay to use a Wednesday afternoon slot?

## 2024-07-11 NOTE — Telephone Encounter (Signed)
 Patient was seen today at 10:00am by Dr.Yan

## 2024-07-11 NOTE — Patient Instructions (Addendum)
" ° °  My Note     Progress Notes  Neurology  07/11/2024 10:18 AM    Kinsula--once a month  [email or web address]  Leqembi--twice a month  [email or web address]   Meds ordered this encounter  Medications   memantine  (NAMENDA ) 10 MG tablet---start this first    Sig: Take 1 tablet (10 mg total) by mouth 2 (two) times daily. after meal    Dispense:  60 tablet    Refill:  11    Staring now   donepezil  (ARICEPT ) 10 MG tablet---start after Feb 2026.    Sig: Take 1 tablet (10 mg total) by mouth at bedtime.    Dispense:  30 tablet    Refill:  3    Start in Feb 2026.     "

## 2024-07-11 NOTE — Telephone Encounter (Signed)
 Dr. Onita had a cancellation today at 10am. Pt said she can come at this time/date.  Pt scheduled

## 2024-07-12 ENCOUNTER — Encounter: Payer: Self-pay | Admitting: Neurology

## 2024-07-13 ENCOUNTER — Encounter: Payer: Self-pay | Admitting: Gastroenterology

## 2024-07-15 ENCOUNTER — Inpatient Hospital Stay: Admission: RE | Admit: 2024-07-15 | Discharge: 2024-07-15 | Attending: Neurology | Admitting: Neurology

## 2024-07-15 DIAGNOSIS — G309 Alzheimer's disease, unspecified: Secondary | ICD-10-CM

## 2024-07-18 ENCOUNTER — Encounter: Admitting: Obstetrics & Gynecology

## 2024-07-18 ENCOUNTER — Ambulatory Visit: Payer: Self-pay | Admitting: Neurology

## 2024-07-19 ENCOUNTER — Encounter: Payer: Self-pay | Admitting: Family Medicine

## 2024-07-23 ENCOUNTER — Encounter: Payer: Self-pay | Admitting: Neurology

## 2024-08-01 ENCOUNTER — Encounter: Payer: Self-pay | Admitting: Neurology

## 2024-08-09 ENCOUNTER — Other Ambulatory Visit: Payer: Self-pay | Admitting: Family Medicine

## 2024-08-09 DIAGNOSIS — Z1231 Encounter for screening mammogram for malignant neoplasm of breast: Secondary | ICD-10-CM

## 2024-08-12 ENCOUNTER — Inpatient Hospital Stay: Admission: RE | Admit: 2024-08-12

## 2024-08-13 ENCOUNTER — Ambulatory Visit: Admitting: Neurology

## 2024-10-28 ENCOUNTER — Encounter: Admitting: Family Medicine

## 2024-11-11 ENCOUNTER — Encounter: Payer: Self-pay | Admitting: Family Medicine

## 2024-12-11 ENCOUNTER — Encounter: Admitting: Family Medicine

## 2024-12-16 ENCOUNTER — Ambulatory Visit: Admitting: Neurology

## 2025-01-13 ENCOUNTER — Ambulatory Visit: Admitting: Neurology
